# Patient Record
Sex: Male | Born: 1990 | Race: White | Hispanic: No | Marital: Single | Smoking: Never smoker
Health system: Southern US, Community
[De-identification: ages and names within clinical notes are randomized; demographics above are authoritative.]

## PROBLEM LIST (undated history)

## (undated) HISTORY — PX: LYMPH NODE BIOPSY: SHX201

---
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
## 2017-09-22 ENCOUNTER — Other Ambulatory Visit: Payer: Self-pay | Admitting: Surgery

## 2017-09-22 DIAGNOSIS — R221 Localized swelling, mass and lump, neck: Secondary | ICD-10-CM

## 2017-10-13 ENCOUNTER — Ambulatory Visit
Admission: RE | Admit: 2017-10-13 | Discharge: 2017-10-13 | Disposition: A | Discharge status: Home / Self Care | Payer: BLUE CROSS/BLUE SHIELD | Source: Ambulatory Visit | Attending: Surgery | Admitting: Surgery

## 2017-10-13 ENCOUNTER — Other Ambulatory Visit: Payer: Self-pay | Admitting: Surgery

## 2017-10-13 DIAGNOSIS — R221 Localized swelling, mass and lump, neck: Secondary | ICD-10-CM

## 2017-10-19 ENCOUNTER — Other Ambulatory Visit: Payer: Self-pay | Admitting: Surgery

## 2017-10-19 DIAGNOSIS — R221 Localized swelling, mass and lump, neck: Secondary | ICD-10-CM

## 2017-10-24 ENCOUNTER — Ambulatory Visit
Admission: RE | Admit: 2017-10-24 | Discharge: 2017-10-24 | Disposition: A | Discharge status: Home / Self Care | Payer: BLUE CROSS/BLUE SHIELD | Source: Ambulatory Visit | Attending: Surgery | Admitting: Surgery

## 2017-10-24 DIAGNOSIS — R221 Localized swelling, mass and lump, neck: Secondary | ICD-10-CM

## 2017-10-24 MED ORDER — IOPAMIDOL (ISOVUE-300) INJECTION 61%
75.0000 mL | Freq: Once | INTRAVENOUS | Status: AC | PRN
  Administered 2017-10-24: 75 mL via INTRAVENOUS

## 2017-11-08 ENCOUNTER — Other Ambulatory Visit (HOSPITAL_COMMUNITY): Payer: Self-pay | Admitting: Surgery

## 2017-11-08 DIAGNOSIS — R221 Localized swelling, mass and lump, neck: Secondary | ICD-10-CM

## 2017-11-14 ENCOUNTER — Other Ambulatory Visit: Payer: Self-pay | Admitting: General Surgery

## 2017-11-14 ENCOUNTER — Other Ambulatory Visit: Payer: Self-pay | Admitting: Radiology

## 2017-11-15 ENCOUNTER — Ambulatory Visit (HOSPITAL_COMMUNITY)
Admission: RE | Admit: 2017-11-15 | Discharge: 2017-11-15 | Disposition: A | Discharge status: Home / Self Care | Payer: BLUE CROSS/BLUE SHIELD | Source: Ambulatory Visit | Attending: Surgery | Admitting: Surgery

## 2017-11-15 DIAGNOSIS — R590 Localized enlarged lymph nodes: Secondary | ICD-10-CM | POA: Insufficient documentation

## 2017-11-15 DIAGNOSIS — R221 Localized swelling, mass and lump, neck: Secondary | ICD-10-CM

## 2017-11-15 LAB — CBC
HCT: 48.4 % (ref 39.0–52.0)
Hemoglobin: 17.2 g/dL — ABNORMAL HIGH (ref 13.0–17.0)
MCH: 30.6 pg (ref 26.0–34.0)
MCHC: 35.5 g/dL (ref 30.0–36.0)
MCV: 86.1 fL (ref 78.0–100.0)
PLATELETS: 194 10*3/uL (ref 150–400)
RBC: 5.62 MIL/uL (ref 4.22–5.81)
RDW: 12.7 % (ref 11.5–15.5)
WBC: 5.5 10*3/uL (ref 4.0–10.5)

## 2017-11-15 LAB — PROTIME-INR
INR: 1.1
PROTHROMBIN TIME: 14.1 s (ref 11.4–15.2)

## 2017-11-15 MED ORDER — MIDAZOLAM HCL 2 MG/2ML IJ SOLN
INTRAMUSCULAR | Status: AC | PRN
  Administered 2017-11-15 (×3): 1 mg via INTRAVENOUS

## 2017-11-15 MED ORDER — FENTANYL CITRATE (PF) 100 MCG/2ML IJ SOLN
INTRAMUSCULAR | Status: AC | PRN
  Administered 2017-11-15 (×2): 50 ug via INTRAVENOUS

## 2017-11-15 MED ORDER — SODIUM CHLORIDE 0.9 % IV SOLN: INTRAVENOUS | Status: DC

## 2017-11-15 MED ORDER — LIDOCAINE HCL (PF) 1 % IJ SOLN
INTRAMUSCULAR | Status: AC
  Filled 2017-11-15: qty 30

## 2017-11-15 MED ORDER — MIDAZOLAM HCL 2 MG/2ML IJ SOLN
INTRAMUSCULAR | Status: AC
  Filled 2017-11-15: qty 4

## 2017-11-15 MED ORDER — FENTANYL CITRATE (PF) 100 MCG/2ML IJ SOLN
INTRAMUSCULAR | Status: AC
  Filled 2017-11-15: qty 4

## 2017-11-15 NOTE — H&P (Signed)
Chief Complaint: Patient was seen in consultation today for right neck mass biopsy at the request of Connor,Chelsea A  Referring Physician(s): Connor,Chelsea A  Supervising Physician: Gilmer MorWagner, Jaime  [facility] Status: [facility] - [facility]  History of Present Illness: Jose Schwartz is a 26 y.o. male   Pt noticed right lymph node approx 1 yr ago Area had not changed ; denies pain When at beach this summer, he was treated for a cut on leg and  was directed to see MD if this LN persisted after treatment. Nonsmoker; no smokeless tobacco He now has had US and CT CT 10/24/17: IMPRESSION: 1. Massively enlarged right level IIa cervical lymph node measuring 3.6 x 2.3 x 3.9 cm, corresponding to the palpable area of concern. Histologic sampling should be considered to assess for possible neoplastic or lymphoproliferative origin. 2. Multiple smaller, adjacent right cervical lymph nodes. 3. No focal abnormality of the pharynx, larynx or salivary glands.  Now scheduled for LN biopsy  No past medical history on file.    Allergies: Patient has no known allergies.  Medications: Prior to Admission medications   Not on File     No family history on file.  Social History   Socioeconomic History  . Marital status: Single    Spouse name: Not on file  . Number of children: Not on file  . Years of education: Not on file  . Highest education level: Not on file  Social Needs  . Financial resource strain: Not on file  . Food insecurity - worry: Not on file  . Food insecurity - inability: Not on file  . Transportation needs - medical: Not on file  . Transportation needs - non-medical: Not on file  Occupational History  . Not on file  Tobacco Use  . Smoking status: Not on file  Substance and Sexual Activity  . Alcohol use: Not on file  . Drug use: Not on file  . Sexual activity: Not on file  Other Topics Concern  . Not on file  Social History Narrative  . Not on file      Review of Systems: A 12 point ROS discussed and pertinent positives are indicated in the HPI above.  All other systems are negative.  Review of Systems  Constitutional: Negative for activity change, fatigue and fever.  HENT: Negative for facial swelling, sore throat and trouble swallowing.   Respiratory: Negative for cough and shortness of breath.   Psychiatric/Behavioral: Negative for behavioral problems and confusion.    Vital Signs: BP 118/82 (BP Location: Right Arm)   Pulse 68   Temp 97.7 F (36.5 C) (Oral)   Ht 5\' 9"  (1.753 m)   Wt 160 lb (72.6 kg)   SpO2 100%   BMI 23.63 kg/m   Physical Exam  Constitutional: He is oriented to person, place, and time. He appears well-nourished.  Cardiovascular: Normal rate and regular rhythm.  Pulmonary/Chest: Effort normal and breath sounds normal.  Abdominal: Soft. Bowel sounds are normal.  Musculoskeletal: Normal range of motion.  Lymphadenopathy:    He has cervical adenopathy.  Neurological: He is alert and oriented to person, place, and time.  Skin: Skin is warm and dry.  Psychiatric: He has a normal mood and affect. His behavior is normal. Judgment and thought content normal.  Nursing note and vitals reviewed.   Imaging: Ct Soft Tissue Neck W Contrast  Result Date: 10/24/2017 CLINICAL DATA:  Right submandibular mass EXAM: CT NECK WITH CONTRAST TECHNIQUE: Multidetector CT imaging of the  neck was performed using the standard protocol following the bolus administration of intravenous contrast. CONTRAST:  75mL ISOVUE-300 IOPAMIDOL (ISOVUE-300) INJECTION 61% COMPARISON:  Neck ultrasound 10/13/2017 FINDINGS: Pharynx and larynx: --Nasopharynx: Fossae of Rosenmuller are clear. Normal adenoid tonsils for age. --Oral cavity and oropharynx: The palatine and lingual tonsils are normal. The visible oral cavity and floor of mouth are normal. --Hypopharynx: Normal vallecula and pyriform sinuses. --Larynx: Normal epiglottis and pre-epiglottic  space. Normal aryepiglottic and vocal folds. --Retropharyngeal space: No abscess, effusion or lymphadenopathy. Salivary glands: --Parotid: No mass lesion or inflammation. No sialolithiasis or ductal dilatation. --Submandibular: Symmetric without inflammation. No sialolithiasis or ductal dilatation. --Sublingual: Normal. No ranula or other visible lesion of the base of tongue and floor of mouth. Thyroid: Normal. Lymph nodes: The palpable area of concern corresponds to an enlarged right level IIa cervical lymph node that measures 3.6 x 2.3 x 3.9 cm (AP x Transverse x CC). A slightly more posterior level IIa node measures 11 mm. No contralateral pathologically enlarged or abnormal density nodes. Vascular: Major cervical vessels are patent. Limited intracranial: Normal. Visualized orbits: Normal. Mastoids and visualized paranasal sinuses: No fluid levels or advanced mucosal thickening. No mastoid effusion. Skeleton: No bony spinal canal stenosis. No lytic or blastic lesions. Upper chest: Clear. Other: None. IMPRESSION: 1. Massively enlarged right level IIa cervical lymph node measuring 3.6 x 2.3 x 3.9 cm, corresponding to the palpable area of concern. Histologic sampling should be considered to assess for possible neoplastic or lymphoproliferative origin. 2. Multiple smaller, adjacent right cervical lymph nodes. 3. No focal abnormality of the pharynx, larynx or salivary glands. Electronically Signed   By: Deatra RobinsonKevin  Herman M.D.   On: 10/24/2017 20:50    Labs:  CBC: No results for input(s): WBC, HGB, HCT, PLT in the last 8760 hours.  COAGS: No results for input(s): INR, APTT in the last 8760 hours.  BMP: No results for input(s): NA, K, CL, CO2, GLUCOSE, BUN, CALCIUM, CREATININE, GFRNONAA, GFRAA in the last 8760 hours.  Invalid input(s): CMP  LIVER FUNCTION TESTS: No results for input(s): BILITOT, AST, ALT, ALKPHOS, PROT, ALBUMIN in the last 8760 hours.  TUMOR MARKERS: No results for input(s): AFPTM, CEA,  CA199, CHROMGRNA in the last 8760 hours.  Assessment and Plan:  Persistent right cervical lymph node enlargement Scheduled for biopsy of same Risks and benefits discussed with the patient including, but not limited to bleeding, infection, damage to adjacent structures or low yield requiring additional tests. All of the patient's questions were answered, patient is agreeable to proceed. Consent signed and in chart.   Thank you for this interesting consult.  I greatly enjoyed meeting Jose JennyLandon Lyles and look forward to participating in their care.  A copy of this report was sent to the requesting provider on this date.  Electronically Signed: Robet LeuURPIN,Ikeem Cleckler A, PA-C 11/15/2017, 12:24 PM   I spent a total of  30 Minutes   in face to face in clinical consultation, greater than 50% of which was counseling/coordinating care for Rt neck LAN biopsy

## 2017-11-15 NOTE — Discharge Instructions (Signed)

## 2017-11-15 NOTE — Procedures (Signed)
Interventional Radiology Procedure Note  Procedure: US guided right neck lymph node, just posterior to the right submandibular gland. Mx 18g core biopsy .  Complications: None Recommendations:  - Ok to shower tomorrow - Do not submerge for 7 days - Routine care   Signed,  Yvone NeuJaime S. Loreta AveWagner, DO

## 2017-11-22 ENCOUNTER — Telehealth: Payer: Self-pay | Admitting: Surgery

## 2017-11-22 NOTE — Telephone Encounter (Signed)
Called and spoke to pt about biopsy results. Excisional biopsy recommended. Will refer to ENT given location.

## 2017-12-20 ENCOUNTER — Other Ambulatory Visit: Payer: Self-pay | Admitting: Otolaryngology

## 2018-01-16 ENCOUNTER — Encounter (HOSPITAL_COMMUNITY): Payer: Self-pay

## 2018-01-16 ENCOUNTER — Encounter (HOSPITAL_COMMUNITY)
Admission: RE | Admit: 2018-01-16 | Discharge: 2018-01-16 | Disposition: A | Discharge status: Home / Self Care | Payer: BLUE CROSS/BLUE SHIELD | Source: Ambulatory Visit | Attending: Otolaryngology | Admitting: Otolaryngology

## 2018-01-16 ENCOUNTER — Other Ambulatory Visit: Payer: Self-pay

## 2018-01-16 DIAGNOSIS — R590 Localized enlarged lymph nodes: Secondary | ICD-10-CM | POA: Diagnosis present

## 2018-01-16 DIAGNOSIS — D696 Thrombocytopenia, unspecified: Secondary | ICD-10-CM | POA: Diagnosis not present

## 2018-01-16 LAB — CBC
HCT: 46.6 % (ref 39.0–52.0)
HEMOGLOBIN: 16.1 g/dL (ref 13.0–17.0)
MCH: 31.4 pg (ref 26.0–34.0)
MCHC: 34.5 g/dL (ref 30.0–36.0)
MCV: 91 fL (ref 78.0–100.0)
Platelets: 89 10*3/uL — ABNORMAL LOW (ref 150–400)
RBC: 5.12 MIL/uL (ref 4.22–5.81)
RDW: 13.2 % (ref 11.5–15.5)
WBC: 6.9 10*3/uL (ref 4.0–10.5)

## 2018-01-16 NOTE — Pre-Procedure Instructions (Signed)
[facility]  [facility]16/[facility]      [facility] [phone] - [name] - [address] AT [facility] [address] [facility] [address] [place] [address] Phone: [phone] Fax: [phone]    Your procedure is scheduled on 01/18/2018  [facility] Admitting at 6:30 A.M.  Call this number if you have problems the morning of surgery:  [phone]   Remember:  Do not eat food or drink liquids after midnight.  On Tuesday   Take these medicines the morning of surgery with A SIP OF WATER : NONE   Do not wear jewelry   Do not wear lotions, powders, or perfumes, or deodorant.              Men may shave face and neck.   Do not bring valuables to the hospital.   [facility] is not responsible for any belongings or valuables.  Contacts, dentures or bridgework may not be worn into surgery.  Leave your suitcase in the car.  After surgery it may be brought to your room.  For patients admitted to the hospital, discharge time will be determined by your treatment team.  Patients discharged the day of surgery will not be allowed to drive home.   Name and phone number of your driver:   With family  Special instructions:  Special Instructions: [place] - Preparing for Surgery  Before surgery, you can play an important role.  Because skin is not sterile, your skin needs to be as free of germs as possible.  You can reduce the number of germs on you skin by washing with CHG (chlorahexidine gluconate) soap before surgery.  CHG is an antiseptic cleaner which kills germs and bonds with the skin to continue killing germs even after washing.  Please DO NOT use if you have an allergy to CHG or antibacterial soaps.  If your skin becomes reddened/irritated stop using the CHG and inform your nurse when you arrive at Short Stay.  Do not shave (including legs and underarms) for at least 48 hours prior to the first CHG shower.  You may shave your face.  Please  follow these instructions carefully:   1.  Shower with CHG Soap the night before surgery and the  morning of Surgery.  2.  If you choose to wash your hair, wash your hair first as usual with your  normal shampoo.  3.  After you shampoo, rinse your hair and body thoroughly to remove the  Shampoo.  4.  Use CHG as you would any other liquid soap.  You can apply chg directly to the skin and wash gently with scrungie or a clean washcloth.  5.  Apply the CHG Soap to your body ONLY FROM THE NECK DOWN.    Do not use on open wounds or open sores.  Avoid contact with your eyes, ears, mouth and genitals (private parts).  Wash genitals (private parts)   with your normal soap.  6.  Wash thoroughly, paying special attention to the area where your surgery will be performed.  7.  Thoroughly rinse your body with warm water from the neck down.  8.  DO NOT shower/wash with your normal soap after using and rinsing off   the CHG Soap.  9.  Pat yourself dry with a clean towel.            10.  Wear clean pajamas.  11.  Place clean sheets on your bed the night of your first shower and do not sleep with pets.  Day of Surgery  Do not apply any lotions/deodorants the morning of surgery.  Please wear clean clothes to the hospital/surgery center.  Please read over the following fact sheets that you were given. Pain Booklet and Surgical Site Infection Prevention

## 2018-01-16 NOTE — Progress Notes (Signed)
Pt. Reports that the lotrimin that he was using on an irritated site in his private area has not cleared it up.  Pt. Reports that its raised & red, was seen in urgent care & had some testing of the area & it was neg. He was also rx'd an oral med. For which he doesn't remember the name of but he only took two doses & then [place] to go [place] & the pharmacy couldn't give him anymore due to insurance issues. Pt. Encouraged to followup with dermatology.

## 2018-01-17 NOTE — Anesthesia Preprocedure Evaluation (Signed)
Anesthesia Evaluation  Patient identified by MRN, date of birth, ID band Patient awake    Reviewed: Allergy & Precautions, NPO status , Patient's Chart, lab work & pertinent test results  Airway Mallampati: II  TM Distance: >3 FB Neck ROM: Full    Dental no notable dental hx.    Pulmonary neg pulmonary ROS,    Pulmonary exam normal breath sounds clear to auscultation       Cardiovascular negative cardio ROS Normal cardiovascular exam Rhythm:Regular Rate:Normal     Neuro/Psych negative neurological ROS  negative psych ROS   GI/Hepatic negative GI ROS, Neg liver ROS,   Endo/Other  negative endocrine ROS  Renal/GU negative Renal ROS  negative genitourinary   Musculoskeletal negative musculoskeletal ROS (+)   Abdominal   Peds negative pediatric ROS (+)  Hematology Thrombocytopenia   Anesthesia Other Findings   Reproductive/Obstetrics negative OB ROS                             Anesthesia Physical Anesthesia Plan  ASA: I  Anesthesia Plan: General   Post-op Pain Management:    Induction: Intravenous  PONV Risk Score and Plan: 1 and Treatment may vary due to age or medical condition, Ondansetron and Dexamethasone  Airway Management Planned: LMA and Oral ETT  Additional Equipment:   Intra-op Plan:   Post-operative Plan: Extubation in OR  Informed Consent: I have reviewed the patients History and Physical, chart, labs and discussed the procedure including the risks, benefits and alternatives for the proposed anesthesia with the patient or authorized representative who has indicated his/her understanding and acceptance.     Plan Discussed with: CRNA, Surgeon and Anesthesiologist  Anesthesia Plan Comments: ( )        Anesthesia Quick Evaluation

## 2018-01-18 ENCOUNTER — Ambulatory Visit (HOSPITAL_COMMUNITY): Payer: BLUE CROSS/BLUE SHIELD | Admitting: Anesthesiology

## 2018-01-18 ENCOUNTER — Encounter (HOSPITAL_COMMUNITY)
Admission: RE | Disposition: A | Discharge status: Home / Self Care | Payer: Self-pay | Source: Ambulatory Visit | Attending: Otolaryngology

## 2018-01-18 ENCOUNTER — Ambulatory Visit (HOSPITAL_COMMUNITY)
Admission: RE | Admit: 2018-01-18 | Discharge: 2018-01-18 | Disposition: A | Discharge status: Home / Self Care | Payer: BLUE CROSS/BLUE SHIELD | Source: Ambulatory Visit | Attending: Otolaryngology | Admitting: Otolaryngology

## 2018-01-18 DIAGNOSIS — D696 Thrombocytopenia, unspecified: Secondary | ICD-10-CM | POA: Insufficient documentation

## 2018-01-18 DIAGNOSIS — R221 Localized swelling, mass and lump, neck: Secondary | ICD-10-CM

## 2018-01-18 DIAGNOSIS — R590 Localized enlarged lymph nodes: Secondary | ICD-10-CM | POA: Insufficient documentation

## 2018-01-18 HISTORY — PX: EXCISION MASS NECK: SHX6703

## 2018-01-18 SURGERY — EXCISION, MASS, NECK
Anesthesia: General | Site: Neck | Laterality: Right

## 2018-01-18 MED ORDER — CEFAZOLIN SODIUM-DEXTROSE 2-4 GM/100ML-% IV SOLN
2.0000 g | INTRAVENOUS | Status: AC
  Administered 2018-01-18: 2 g via INTRAVENOUS
  Filled 2018-01-18: qty 100

## 2018-01-18 MED ORDER — KETOROLAC TROMETHAMINE 30 MG/ML IJ SOLN: 30.0000 mg | Freq: Once | INTRAMUSCULAR | Status: DC | PRN

## 2018-01-18 MED ORDER — PROPOFOL 10 MG/ML IV BOLUS
INTRAVENOUS | Status: DC | PRN
  Administered 2018-01-18: 50 mg via INTRAVENOUS
  Administered 2018-01-18: 200 mg via INTRAVENOUS

## 2018-01-18 MED ORDER — OXYCODONE HCL 5 MG PO TABS
5.0000 mg | ORAL_TABLET | Freq: Once | ORAL | Status: AC | PRN
  Administered 2018-01-18: 5 mg via ORAL

## 2018-01-18 MED ORDER — PHENYLEPHRINE HCL 10 MG/ML IJ SOLN
INTRAMUSCULAR | Status: DC | PRN
  Administered 2018-01-18: 80 ug via INTRAVENOUS

## 2018-01-18 MED ORDER — OXYCODONE HCL 5 MG PO TABS
ORAL_TABLET | ORAL | Status: AC
  Filled 2018-01-18: qty 1

## 2018-01-18 MED ORDER — EPHEDRINE 5 MG/ML INJ
INTRAVENOUS | Status: AC
  Filled 2018-01-18: qty 10

## 2018-01-18 MED ORDER — FENTANYL CITRATE (PF) 250 MCG/5ML IJ SOLN
INTRAMUSCULAR | Status: AC
  Filled 2018-01-18: qty 5

## 2018-01-18 MED ORDER — DEXAMETHASONE SODIUM PHOSPHATE 10 MG/ML IJ SOLN
10.0000 mg | Freq: Once | INTRAMUSCULAR | Status: AC
  Administered 2018-01-18: 10 mg via INTRAVENOUS
  Filled 2018-01-18: qty 1

## 2018-01-18 MED ORDER — FENTANYL CITRATE (PF) 250 MCG/5ML IJ SOLN
INTRAMUSCULAR | Status: DC | PRN
  Administered 2018-01-18: 100 ug via INTRAVENOUS

## 2018-01-18 MED ORDER — MIDAZOLAM HCL 5 MG/5ML IJ SOLN
INTRAMUSCULAR | Status: DC | PRN
  Administered 2018-01-18: 2 mg via INTRAVENOUS

## 2018-01-18 MED ORDER — CHLORHEXIDINE GLUCONATE CLOTH 2 % EX PADS: 6.0000 | MEDICATED_PAD | Freq: Once | CUTANEOUS | Status: DC

## 2018-01-18 MED ORDER — FENTANYL CITRATE (PF) 100 MCG/2ML IJ SOLN: 25.0000 ug | INTRAMUSCULAR | Status: DC | PRN

## 2018-01-18 MED ORDER — OXYCODONE HCL 5 MG/5ML PO SOLN: 5.0000 mg | Freq: Once | ORAL | Status: AC | PRN

## 2018-01-18 MED ORDER — HYDROCODONE-ACETAMINOPHEN 5-325 MG PO TABS: 1.0000 | ORAL_TABLET | Freq: Four times a day (QID) | ORAL | 0 refills | Status: DC | PRN

## 2018-01-18 MED ORDER — PROPOFOL 10 MG/ML IV BOLUS
INTRAVENOUS | Status: AC
  Filled 2018-01-18: qty 40

## 2018-01-18 MED ORDER — LACTATED RINGERS IV SOLN
INTRAVENOUS | Status: DC | PRN
  Administered 2018-01-18 (×2): via INTRAVENOUS

## 2018-01-18 MED ORDER — ROCURONIUM BROMIDE 10 MG/ML (PF) SYRINGE
PREFILLED_SYRINGE | INTRAVENOUS | Status: AC
  Filled 2018-01-18: qty 5

## 2018-01-18 MED ORDER — LIDOCAINE-EPINEPHRINE 1 %-1:100000 IJ SOLN
INTRAMUSCULAR | Status: DC | PRN
  Administered 2018-01-18: 2 mL

## 2018-01-18 MED ORDER — MEPERIDINE HCL 50 MG/ML IJ SOLN: 6.2500 mg | INTRAMUSCULAR | Status: DC | PRN

## 2018-01-18 MED ORDER — SUCCINYLCHOLINE CHLORIDE 200 MG/10ML IV SOSY
PREFILLED_SYRINGE | INTRAVENOUS | Status: AC
  Filled 2018-01-18: qty 10

## 2018-01-18 MED ORDER — ACETAMINOPHEN 325 MG PO TABS: 325.0000 mg | ORAL_TABLET | ORAL | Status: DC | PRN

## 2018-01-18 MED ORDER — ROCURONIUM BROMIDE 100 MG/10ML IV SOLN
INTRAVENOUS | Status: DC | PRN
  Administered 2018-01-18: 50 mg via INTRAVENOUS

## 2018-01-18 MED ORDER — 0.9 % SODIUM CHLORIDE (POUR BTL) OPTIME
TOPICAL | Status: DC | PRN
  Administered 2018-01-18: 1000 mL

## 2018-01-18 MED ORDER — EPHEDRINE SULFATE 50 MG/ML IJ SOLN
INTRAMUSCULAR | Status: DC | PRN
  Administered 2018-01-18: 10 mg via INTRAVENOUS
  Administered 2018-01-18: 5 mg via INTRAVENOUS
  Administered 2018-01-18: 10 mg via INTRAVENOUS

## 2018-01-18 MED ORDER — MIDAZOLAM HCL 2 MG/2ML IJ SOLN
INTRAMUSCULAR | Status: AC
  Filled 2018-01-18: qty 2

## 2018-01-18 MED ORDER — LIDOCAINE-EPINEPHRINE 1 %-1:100000 IJ SOLN
INTRAMUSCULAR | Status: AC
  Filled 2018-01-18: qty 1

## 2018-01-18 MED ORDER — ONDANSETRON HCL 4 MG/2ML IJ SOLN
INTRAMUSCULAR | Status: DC | PRN
  Administered 2018-01-18: 4 mg via INTRAVENOUS

## 2018-01-18 MED ORDER — ONDANSETRON HCL 4 MG/2ML IJ SOLN
INTRAMUSCULAR | Status: AC
Start: 2018-01-18 — End: ?
  Filled 2018-01-18: qty 2

## 2018-01-18 MED ORDER — LIDOCAINE 2% (20 MG/ML) 5 ML SYRINGE
INTRAMUSCULAR | Status: AC
  Filled 2018-01-18: qty 5

## 2018-01-18 MED ORDER — ONDANSETRON HCL 4 MG/2ML IJ SOLN: 4.0000 mg | Freq: Once | INTRAMUSCULAR | Status: DC | PRN

## 2018-01-18 MED ORDER — ACETAMINOPHEN 160 MG/5ML PO SOLN: 325.0000 mg | ORAL | Status: DC | PRN

## 2018-01-18 MED ORDER — SUGAMMADEX SODIUM 200 MG/2ML IV SOLN
INTRAVENOUS | Status: DC | PRN
  Administered 2018-01-18: 147.8 mg via INTRAVENOUS

## 2018-01-18 SURGICAL SUPPLY — 26 items
BLADE SURG 15 STRL LF DISP TIS (BLADE) ×1 IMPLANT
BLADE SURG 15 STRL SS (BLADE) ×2
CLEANER TIP ELECTROSURG 2X2 (MISCELLANEOUS) IMPLANT
COVER SURGICAL LIGHT HANDLE (MISCELLANEOUS) ×3 IMPLANT
DERMABOND ADHESIVE PROPEN (GAUZE/BANDAGES/DRESSINGS) ×2
DERMABOND ADVANCED (GAUZE/BANDAGES/DRESSINGS) ×2
DERMABOND ADVANCED .7 DNX12 (GAUZE/BANDAGES/DRESSINGS) ×1 IMPLANT
DERMABOND ADVANCED .7 DNX6 (GAUZE/BANDAGES/DRESSINGS) ×1 IMPLANT
DRAPE HALF SHEET 40X57 (DRAPES) IMPLANT
ELECT COATED BLADE 2.86 ST (ELECTRODE) ×3 IMPLANT
ELECT REM PT RETURN 9FT ADLT (ELECTROSURGICAL)
ELECTRODE REM PT RTRN 9FT ADLT (ELECTROSURGICAL) IMPLANT
GAUZE SPONGE 4X4 16PLY XRAY LF (GAUZE/BANDAGES/DRESSINGS) ×3 IMPLANT
GLOVE BIOGEL M 7.0 STRL (GLOVE) ×3 IMPLANT
GOWN STRL REUS W/ TWL LRG LVL3 (GOWN DISPOSABLE) ×2 IMPLANT
GOWN STRL REUS W/TWL LRG LVL3 (GOWN DISPOSABLE) ×4
KIT BASIN OR (CUSTOM PROCEDURE TRAY) ×3 IMPLANT
KIT ROOM TURNOVER OR (KITS) ×3 IMPLANT
NEEDLE HYPO 25GX1X1/2 BEV (NEEDLE) ×3 IMPLANT
NS IRRIG 1000ML POUR BTL (IV SOLUTION) ×3 IMPLANT
PAD ARMBOARD 7.5X6 YLW CONV (MISCELLANEOUS) ×6 IMPLANT
PENCIL BUTTON HOLSTER BLD 10FT (ELECTRODE) IMPLANT
SUT VICRYL 4-0 PS2 18IN ABS (SUTURE) IMPLANT
SYR CONTROL 10ML LL (SYRINGE) ×3 IMPLANT
TOWEL OR 17X24 6PK STRL BLUE (TOWEL DISPOSABLE) ×3 IMPLANT
TRAY ENT MC OR (CUSTOM PROCEDURE TRAY) ×3 IMPLANT

## 2018-01-18 NOTE — Anesthesia Procedure Notes (Signed)
Procedure Name: Intubation Date/Time: 01/18/2018 8:35 AM Performed by: Mariea Clonts, CRNA Pre-anesthesia Checklist: Patient identified, Emergency Drugs available, Suction available, Patient being monitored and Timeout performed Patient Re-evaluated:Patient Re-evaluated prior to induction Oxygen Delivery Method: Circle system utilized Preoxygenation: Pre-oxygenation with 100% oxygen Induction Type: IV induction Ventilation: Mask ventilation without difficulty Laryngoscope Size: Mac and 4 Grade View: Grade I Tube type: Oral Tube size: 7.5 mm Number of attempts: 1 Airway Equipment and Method: Stylet Placement Confirmation: ETT inserted through vocal cords under direct vision,  positive ETCO2 and breath sounds checked- equal and bilateral Secured at: 23 cm Tube secured with: Tape Dental Injury: Teeth and Oropharynx as per pre-operative assessment

## 2018-01-18 NOTE — Transfer of Care (Signed)
Immediate Anesthesia Transfer of Care Note  Patient: Jose Schwartz  Procedure(s) Performed: EXCISION MASS NECK (Right Neck)  Patient Location: PACU  Anesthesia Type:General  Level of Consciousness: awake, alert  and oriented  Airway & Oxygen Therapy: Patient Spontanous Breathing and Patient connected to face mask oxygen  Post-op Assessment: Report given to RN, Post -op Vital signs reviewed and stable and Patient moving all extremities X 4  Post vital signs: Reviewed and stable  Last Vitals:  Vitals:   01/18/18 0648 01/18/18 1000  BP: 133/70 (!) 123/55  Pulse: (!) 57 86  Resp: 20 19  Temp: (!) 36.3 C 36.8 C  SpO2: 99% 100%    Last Pain:  Vitals:   01/18/18 0648  TempSrc: Oral         Complications: No apparent anesthesia complications

## 2018-01-18 NOTE — H&P (Signed)
Jose Schwartz is an 27 y.o. male.   Chief Complaint: Right neck mass HPI: History of enlarging right neck mass, needle bx positive for abn cells   No past medical history on file.  Past Surgical History:  Procedure Laterality Date  . ADENOIDECTOMY W/ MYRINGOTOMY  2003    No family history on file. Social History:  reports that  has never smoked. he has never used smokeless tobacco. He reports that he drinks alcohol. He reports that he does not use drugs.  Allergies: No Known Allergies  Medications Prior to Admission  Medication Sig Dispense Refill  . clotrimazole (LOTRIMIN) 1 % cream Apply 1 application topically 2 (two) times daily. Should be completed by 01-14-18    . Multiple Vitamins-Minerals (ADULT GUMMY PO) Take 2 tablets by mouth daily.      Results for orders placed or performed during the hospital encounter of 01/16/18 (from the past 48 hour(s))  CBC     Status: Abnormal   Collection Time: 01/16/18  4:21 PM  Result Value Ref Range   WBC 6.9 4.0 - 10.5 K/uL   RBC 5.12 4.22 - 5.81 MIL/uL   Hemoglobin 16.1 13.0 - 17.0 g/dL   HCT 16.146.6 09.639.0 - 04.552.0 %   MCV 91.0 78.0 - 100.0 fL   MCH 31.4 26.0 - 34.0 pg   MCHC 34.5 30.0 - 36.0 g/dL   RDW 40.913.2 81.111.5 - 91.415.5 %   Platelets 89 (L) 150 - 400 K/uL    Comment: REPEATED TO VERIFY PLATELET COUNT CONFIRMED BY SMEAR Performed at [facility], [address] [place], [place] [address]    No results found.  Review of Systems  Constitutional: Negative.   HENT: Negative.   Respiratory: Negative.   Cardiovascular: Negative.     Blood pressure 133/70, pulse (!) 57, temperature (!) 97.4 F (36.3 C), temperature source Oral, resp. rate 20, weight 73.9 kg (163 lb), SpO2 99 %. Physical Exam  Constitutional: He appears well-developed and well-nourished.  HENT:  Right neck mass  Neck: Normal range of motion. Neck supple.  Cardiovascular: Normal rate.  Respiratory: Effort normal.  GI: Soft.  Lymphadenopathy:    He has  cervical adenopathy.     Assessment/Plan Adm for Excisional biopsy right neck mass  Alter Moss, MD 01/18/2018, 8:27 AM

## 2018-01-18 NOTE — Anesthesia Postprocedure Evaluation (Signed)
Anesthesia Post Note  Patient: Rebekah ChesterfieldLandon Dean Riggle  Procedure(s) Performed: EXCISION MASS NECK (Right Neck)     Patient location during evaluation: PACU Anesthesia Type: General Level of consciousness: awake and alert Pain management: pain level controlled Vital Signs Assessment: post-procedure vital signs reviewed and stable Respiratory status: spontaneous breathing, nonlabored ventilation, respiratory function stable and patient connected to nasal cannula oxygen Cardiovascular status: blood pressure returned to baseline and stable Postop Assessment: no apparent nausea or vomiting Anesthetic complications: no    Last Vitals:  Vitals:   01/18/18 1027 01/18/18 1035  BP: 126/61 130/61  Pulse: 64 60  Resp: 14   Temp: 36.8 C   SpO2: 99% 100%    Last Pain:  Vitals:   01/18/18 1035  TempSrc:   PainSc: 0-No pain                 Rosabelle Jupin

## 2018-01-18 NOTE — Brief Op Note (Signed)
01/18/2018  9:52 AM  PATIENT:  Jose ChesterfieldLandon Dean Padovano  27 y.o. male  PRE-OPERATIVE DIAGNOSIS:  NECK MASS  POST-OPERATIVE DIAGNOSIS:  NECK MASS  PROCEDURE:  Procedure(s): EXCISION MASS NECK (Right)  SURGEON:  Surgeon(s) and Role:    Osborn Coho* Zhanna Melin, MD - Primary  PHYSICIAN ASSISTANT:   ASSISTANTS: Beckey RutterLouise Nordhbladh, PA   ANESTHESIA:   general  EBL:  Minimal  BLOOD ADMINISTERED:none  DRAINS: none   LOCAL MEDICATIONS USED:  LIDOCAINE  and Amount: 2 ml  SPECIMEN:  Source of Specimen:  Right deep neck  DISPOSITION OF SPECIMEN:  PATHOLOGY  COUNTS:  YES  TOURNIQUET:  * No tourniquets in log *  DICTATION: .Other Dictation: Dictation Number [id number]  PLAN OF CARE: Discharge to home after PACU  PATIENT DISPOSITION:  PACU - hemodynamically stable.   Delay start of Pharmacological VTE agent (>24hrs) due to surgical blood loss or risk of bleeding: not applicable

## 2018-01-19 ENCOUNTER — Encounter (HOSPITAL_COMMUNITY): Payer: Self-pay | Admitting: Otolaryngology

## 2018-01-19 NOTE — Op Note (Signed)
NAME:  Jose Schwartz, Jose Schwartz                   ACCOUNT NO.:  MEDICAL RECORD NO.:  [id number]  LOCATION:                                 FACILITY:  PHYSICIAN:  Kinnie Scalesavid L. Annalee GentaShoemaker, M.D.DATE OF BIRTH:  [birth year]  DATE OF PROCEDURE:  01/18/2018 DATE OF DISCHARGE:                              OPERATIVE REPORT   [facility].  PREOPERATIVE DIAGNOSIS:  Right deep neck lymph node.  POSTOPERATIVE DIAGNOSIS:  Right deep neck lymph node.  INDICATION FOR SURGERY:  Right deep neck lymph node.  SURGICAL PROCEDURE:  Excision of right deep neck lymph node.  ANESTHESIA:  General endotracheal.  SURGEON:  Kinnie Scalesavid L. Annalee GentaShoemaker, M.D.  ASSISTANT:  Aquilla HackerLouise Nordbladh, [facility]  There were no complications.  ESTIMATED BLOOD LOSS:  Minimal.  The patient transferred from the operating room to the recovery room in stable condition.  BRIEF HISTORY:  The patient is an, otherwise, healthy 27 year old white male, who was referred to our office for evaluation of an enlarging right superior lateral neck mass.  The patient noted a small asymptomatic mass when shaving.  The mass has been gradually getting bigger.  He was worked up through his medical physician with a needle biopsy performed under ultrasound guidance.  Pathology from the biopsy tissue showed abnormal cells, but the pathologist was unable to make a definitive diagnosis.  The patient underwent CT scanning which showed a 3 cm isolated mass in the right superior lateral neck adjacent to the jugular vein, consistent with lymph node enlargement.  There were no other lymph node or abnormal physical findings.  Based on the patient's history and examination, I recommended excisional biopsy of the mass under general anesthesia.  The risks and benefits of the procedure were discussed in detail with the patient and his family and they understood and agreed with our plan for surgery which was scheduled on an elective basis at [facility].  DESCRIPTION OF PROCEDURE:  The patient was brought to the operating room on January 18, 2018, and placed in a supine position on the operating table.  General endotracheal anesthesia was established without difficulty.  When the patient was adequately anesthetized, he was positioned on the operating table.  A surgical time-out was then performed, the correct identification of the patient and the surgical procedure and the right laterality of the neck mass.  The patient's skin was then injected with 2 mL of 1% lidocaine with 1:100,000 dilution of epinephrine which was injected in a subcutaneous fashion in the skin overlying the palpable neck mass.  The patient was then prepped and draped and prepared for surgery.  With the patient prepared for surgery, a 3 cm horizontally oriented skin incision was created in a preexisting skin crease.  This was carried through the skin underlying subcutaneous tissue.  Platysma muscle was identified and divided using Bovie electrocautery.  Subplatysmal flaps were then elevated superiorly and inferiorly.  The anterior border of the sternocleidomastoid muscle was identified, and this was retracted posteriorly.  Dissection was then carried out deep to the sternocleidomastoid muscle.  There was a large palpable firm soft tissue mass.  The surrounding soft  tissue was carefully divided with cautery and blunt and sharp dissections were undertaken.  Several small blood vessels were divided and suture ligated.  The mass was then elevated from the deep compartment of the neck.  The jugular vein was identified. The posterior belly of the digastric muscle was identified.  The hypoglossal nerve was identified and preserved.  The mass was then removed in its entirety and sent to Pathology for gross and microscopic evaluation and lymphoma workup.  The patient's wound was then irrigated with saline.  There was no active bleeding.  The incision was  closed in multiple layers beginning with reapproximation of the platysma muscle with interrupted 4-0 Vicryl suture.  The deep subcutaneous tissue was then closed with interrupted 5-0 Vicryl suture, and the immediate subcutaneous closure was with interrupted 5-0 Vicryl in a horizontal mattress fashion.  The final skin edge was closed with Dermabond surgical glue.  The patient was then awakened from his anesthetic, extubated, and transferred from the operating room to the recovery room in stable condition.  There were no complications, and estimated blood loss was minimal.          ______________________________ Kinnie Scales. Annalee Genta, M.D.     DLS/MEDQ  D:  16/09/9603  T:  01/19/2018  Job:  [id number]

## 2018-02-01 ENCOUNTER — Telehealth: Payer: Self-pay

## 2018-02-01 NOTE — Telephone Encounter (Signed)
Received call to the office today for urgent referral from Dr. Annalee GentaShoemaker for potential marginal zone lymphoma. Sue LushAndrea, new Patient Scheduler attempted to call pt to have him scheduled at 0840 or 1520 tomorrow, but pt was unaware of the reason for referral and did not want to schedule an appointment at this time. [facility] and left VM with Many, assistant to Dr. Annalee GentaShoemaker to please call the pt as soon as possible so that we would be able to have an initial visit with the pt tomorrow afternoon if possible. [facility] [phone]

## 2018-02-01 NOTE — Progress Notes (Signed)
HEMATOLOGY/ONCOLOGY CONSULTATION NOTE  Date of Service: 02/02/2018  Patient Care Team: Patient, No Pcp Per as PCP - General (General Practice)  CHIEF COMPLAINTS/PURPOSE OF CONSULTATION:  Cervical Lymphadenopathy concerning for lymhpoma  HISTORY OF PRESENTING ILLNESS:   Jose Schwartz is a wonderful 27 y.o. male who has been referred to us by ENT Dr. 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 He is accompanied today by his parents and girlfriend. The pt reports that he is doing well overall.   The pt notes that he has generally been healthy and has not had any medical problems. He first noted a lump on his right neck a year ago, which initially grew very slowly and noted that he felt completely fine.  The rt neck lump gradually become more noticeable and he sought further evaluation.   On 10/24/17 the pt had a CT Soft Tissue Neck revealing 1. Massively enlarged right level IIa cervical lymph node measuring 3.6 x 2.3 x 3.9 cm, corresponding to the palpable area of concern. Histologic sampling should be considered to assess for possible neoplastic or lymphoproliferative origin. 2. Multiple smaller, adjacent right cervical lymph nodes. 3. No focal abnormality of the pharynx, larynx or salivary glands.   The pt had a US guided core biopsy on 11/15/17 showing prominent B cell population with atypical lymphoid proliferation without a definitive diagnosis of lymphoma.   The pt has had excisional biopsy of a right superior lateral neck mass completed on 01/18/18 revealing an abnormal lymphoid proliferation.  Most recent lab results (01/16/18) of CBC  is as follows: all values are WNL except for Platelets at 89k.  He notes that in June 2018 he got a minor cut on his lower right leg that became infected and took antibiotics. He notes that in the last two months he received US guided biopsy, and an excisional biopsy.   He notes that about 6 weeks ago he  noticed a discrete macular dot like rash on his bilateral flanks, under his bilateral arms, wrists, and on his genitals. He then presented to the urgent care on [address] where he had an infection work up and was prescribed Fluconazole and anti-fungal cream that did not clear up his rash. He notes that his rash appeared over a day or two and  that he did not associate its appearance with anything in particular; he notes being very active in the gym and playing basketball and believed his rash to be sports related. He notes not being sure where his rash began first, and that it persists today.   He denies having any animal contact nor owning any pets. He notes that the spots have just started flaking. He notes that the spot on his penis first appeared as bright red but has been resolving slowly. He has stopped using his anti-fungal cream.   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   He notes that about 4 years ago he had a chest rash for which we had a skin biopsy, that was unrevealing and his rash cleared up. [facility] was [facility] [place]. He notes that his dots were white colored and looked similarly to pimples.  He took a medication that he used in the shower and is not sure what it was.  He notes that he had a ganglion cyst on his right wrist in the past.   He notes  that two days ago his throat became sore and also felt weak and felt some chills two nights ago which he treated with Aleve and cough drops. He notes currently feeling weak. He denies taking NSAIDs regularly outside of the last two days. He denies taking steroids or prednisone anytime in the recent past.  On review of systems, pt reports weakness, throat pain, and denies ear aches, fevers, chills, night sweats over the last year, other lumps or bumps, testicular pain or swelling, penal discharge, pain along the spine, abdominal pain, and  any other symptoms.  On PMHx the pt reports having tubes in his ears when he was little, recurrent cold sores, denies unsafe sexual exposure and denies needle or drug use. On Social Hx the pt reports no history of smoking, and works as a Pharmacist, hospital.  On Family Hx the pt reports his mother and her family have had psoriasis and thyroid issues. His father's side has had eczema problems.   MEDICAL HISTORY:  No past medical history on file.  SURGICAL HISTORY: Past Surgical History:  Procedure Laterality Date  . ADENOIDECTOMY W/ MYRINGOTOMY  2003  . EXCISION MASS NECK Right 01/18/2018   Procedure: EXCISION MASS NECK;  Surgeon: Osborn Coho, MD;  Location: [facility] OR;  Service: ENT;  Laterality: Right;    SOCIAL HISTORY: Social History   Socioeconomic History  . Marital status: Single    Spouse name: Not on file  . Number of children: Not on file  . Years of education: Not on file  . Highest education level: Not on file  Social Needs  . Financial resource strain: Not on file  . Food insecurity - worry: Not on file  . Food insecurity - inability: Not on file  . Transportation needs - medical: Not on file  . Transportation needs - non-medical: Not on file  Occupational History  . Not on file  Tobacco Use  . Smoking status: Never Smoker  . Smokeless tobacco: Never Used  Substance and Sexual Activity  . Alcohol use: Yes    Comment: socially- 2 drinks per week   . Drug use: No  . Sexual activity: Not on file  Other Topics Concern  . Not on file  Social History Narrative  . Not on file    FAMILY HISTORY: No family history on file.  ALLERGIES:  has No Known Allergies.  MEDICATIONS:  Current Outpatient Medications  Medication Sig Dispense Refill  . clotrimazole (LOTRIMIN) 1 % cream Apply 1 application topically 2 (two) times daily. Should be completed by 01-14-18    . HYDROcodone-acetaminophen (NORCO) 5-325 MG tablet Take 1-2 tablets by mouth every 6 (six) hours as needed.  20 tablet 0  . Multiple Vitamins-Minerals (ADULT GUMMY PO) Take 2 tablets by mouth daily.     No current facility-administered medications for this visit.     REVIEW OF SYSTEMS:    10 Point review of Systems was done is negative except as noted above.  PHYSICAL EXAMINATION: ECOG PERFORMANCE STATUS: 0 - Asymptomatic  . Vitals:   02/02/18 0848  BP: 128/71  Pulse: 65  Resp: 17  Temp: 98.3 F (36.8 C)  SpO2: 100%   Filed Weights   02/02/18 0848  Weight: 158 lb (71.7 kg)   .Body mass index is 22.83 kg/m.  GENERAL:alert, in no acute distress and comfortable SKIN: no acute rashes, no significant lesions EYES: conjunctiva are pink and non-injected, sclera anicteric OROPHARYNX: MMM, no exudates, no oropharyngeal erythema or ulceration NECK: supple, no  JVD LYMPH:  no palpable lymphadenopathy in the cervical, axillary or inguinal regions LUNGS: clear to auscultation b/l with normal respiratory effort HEART: regular rate & rhythm ABDOMEN:  normoactive bowel sounds , non tender, not distended. Extremity: no pedal edema PSYCH: alert & oriented x 3 with fluent speech NEURO: no focal motor/sensory deficits  LABORATORY DATA:  I have reviewed the data as listed  . CBC Latest Ref Rng & Units 02/02/2018 01/16/2018 11/15/2017  WBC 4.0 - 10.3 K/uL 8.5 6.9 5.5  Hemoglobin 13.0 - 17.1 g/dL 16.1 09.6 17.2(H)  Hematocrit 38.4 - 49.9 % 45.4 46.6 48.4  Platelets 140 - 400 K/uL 219 89(L) 194    . CMP Latest Ref Rng & Units 02/02/2018  Glucose 70 - 140 mg/dL 95  BUN 7 - 26 mg/dL 16  Creatinine 0.45 - 4.09 mg/dL 8.11  Sodium 914 - 782 mmol/L 143  Potassium 3.5 - 5.1 mmol/L 4.1  Chloride 98 - 109 mmol/L 107  CO2 22 - 29 mmol/L 25  Calcium 8.4 - 10.4 mg/dL 95.6  Total Protein 6.4 - 8.3 g/dL 7.6  Total Bilirubin 0.2 - 1.2 mg/dL 2.1(H)  Alkaline Phos 40 - 150 U/L 81  AST 5 - 34 U/L 18  ALT 0 - 55 U/L 22   Component     Latest Ref Rng & Units 02/02/2018  CRP     <1.0 mg/dL 1.4 (H)    Sed Rate     0 - 16 mm/hr 5  Hep B S Ab      Non Reactive  Hepatitis B Surface Ag     Negative Negative  HIV Screen 4th Generation wRfx     Non Reactive Non Reactive  HCV Ab     0.0 - 0.9 s/co ratio <0.1  LDH     125 - 245 U/L 149     01/18/18 Soft Tissue Mass Pathology:    01/18/18 Tissue Flow Cytometry:    RADIOGRAPHIC STUDIES: I have personally reviewed the radiological images as listed and agreed with the findings in the report.  CT soft tissue neck (10/24/2018) CT NECK WITH CONTRAST  TECHNIQUE: Multidetector CT imaging of the neck was performed using the standard protocol following the bolus administration of intravenous contrast.  CONTRAST:  75mL ISOVUE-300 IOPAMIDOL (ISOVUE-300) INJECTION 61%  COMPARISON:  Neck ultrasound 10/13/2017  FINDINGS: Pharynx and larynx:  --Nasopharynx: Fossae of Rosenmuller are clear. Normal adenoid tonsils for age.  --Oral cavity and oropharynx: The palatine and lingual tonsils are normal. The visible oral cavity and floor of mouth are normal.  --Hypopharynx: Normal vallecula and pyriform sinuses.  --Larynx: Normal epiglottis and pre-epiglottic space. Normal aryepiglottic and vocal folds.  --Retropharyngeal space: No abscess, effusion or lymphadenopathy.  Salivary glands:  --Parotid: No mass lesion or inflammation. No sialolithiasis or ductal dilatation.  --Submandibular: Symmetric without inflammation. No sialolithiasis or ductal dilatation.  --Sublingual: Normal. No ranula or other visible lesion of the base of tongue and floor of mouth.  Thyroid: Normal.  Lymph nodes: The palpable area of concern corresponds to an enlarged right level IIa cervical lymph node that measures 3.6 x 2.3 x 3.9 cm (AP x Transverse x CC). A slightly more posterior level IIa node measures 11 mm. No contralateral pathologically enlarged or abnormal density nodes.  Vascular: Major cervical vessels are  patent.  Limited intracranial: Normal.  Visualized orbits: Normal.  Mastoids and visualized paranasal sinuses: No fluid levels or advanced mucosal thickening. No mastoid effusion.  Skeleton: No bony spinal canal stenosis.  No lytic or blastic lesions.  Upper chest: Clear.  Other: None.  IMPRESSION: 1. Massively enlarged right level IIa cervical lymph node measuring 3.6 x 2.3 x 3.9 cm, corresponding to the palpable area of concern. Histologic sampling should be considered to assess for possible neoplastic or lymphoproliferative origin. 2. Multiple smaller, adjacent right cervical lymph nodes. 3. No focal abnormality of the pharynx, larynx or salivary glands.   Electronically Signed   By: Deatra Robinson M.D.   On: 10/24/2017 20:50  ASSESSMENT & PLAN:   27 y.o. male with  1. Rt Cervical Lymphadenopathy S/p Excisional LN biopsy -pathology still under review with 2nd opinion from [facility]. 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  No overt constitutional symptoms. No anemia. Mild thrombocytopenia - now resolved HCV neg. HIV neg  2. Thrombocytopenia ? Related to lymphoma vs medications (NSAIDS) Appears likely related to NSAIDS or passing viral infection with exanthem. Rpt labs today show resolution of thrombocytopenia.  3. Macular rash over penis/trunk and wrist -- scaling and resolving now.  ?Viral exanthem. ? Related to lymphoproliferative process. ?medication related. Had previous chest wall rash a few years ago and was biopsied. We will reach out to [facility] in [place], [place] for the patient's skin biopsy several years ago.  PLAN   -Discussed patient's most recent labs 01/16/18 showing all values WNL except for platelets at 89k.  -Discussed that the initial biopsies in December 2018 could not fully distinguish if the abnormal lymphocytes were reactive or clonal. Cytometry did not reveal definitive clonal populations either.  I discussed his pathology case in details with our pathologist Dr Ronalee Belts. It has been sent to [facility] for a 2nd opinion. He is concerned pathology appears to be suggestive of Pediatric Nodal Marginal zone lymphoma. Not consistent with Hodgkins lymphoma -Discussed that we will discuss the patient's case with tumor board next week.  -Discussed the patient's 10/24/17 CT scan.  -PET scan to understand the pattern of involvement and r/o metastasis and guide further w/u and treatment -Blood flow cytometry - done today neg for clonal lymphocytes --Rash is not confluent, no palpable enlarged lymph nodes, no palpable splenomegaly or hepatomegaly. -Asked pt to note if rash gets worse and we will likely send pt to a dermatologist for a biopsy. -Recommended not taking NSAIDs, but taking Tylenol if necessary. -labs ordered for additional evaluation of rash and lymphoma as noted below . Orders Placed This Encounter  Procedures  . NM PET Image Initial (PI) Skull Base To Thigh    Standing Status:   Future    Standing Expiration Date:   02/02/2019    Order Specific Question:   If indicated for the ordered procedure, I authorize the administration of a radiopharmaceutical per Radiology protocol    Answer:   Yes    Order Specific Question:   Preferred imaging location?    Answer:   [facility]    Order Specific Question:   Radiology Contrast Protocol - do NOT remove file path    Answer:   [email or web address]    Order Specific Question:   Reason for Exam additional comments    Answer:   rt cervical lymphadenopathy -concern for lymphoma for initial staging and to direct diagnostic workup  . CBC & Diff and Retic    Standing Status:   Future    Number of Occurrences:   1    Standing Expiration Date:   02/02/2019  . CMP (Cancer Center only)  Standing Status:   Future    Number of Occurrences:   1    Standing Expiration Date:   02/02/2019  . Lactate dehydrogenase     Standing Status:   Future    Number of Occurrences:   1    Standing Expiration Date:   02/02/2019  . Hepatitis C antibody    Standing Status:   Future    Number of Occurrences:   1    Standing Expiration Date:   02/02/2019  . HIV antibody    Standing Status:   Future    Number of Occurrences:   1    Standing Expiration Date:   02/02/2019  . Hepatitis B surface antigen    Standing Status:   Future    Number of Occurrences:   1    Standing Expiration Date:   02/02/2019  . Hepatitis B surface antibody    Standing Status:   Future    Number of Occurrences:   1    Standing Expiration Date:   03/09/2019  . Smear    Standing Status:   Future    Number of Occurrences:   1    Standing Expiration Date:   02/02/2019  . Flow Cytometry    Atypical lymphocytes and lymphadenopathy-- concern for lymphoma    Standing Status:   Future    Number of Occurrences:   1    Standing Expiration Date:   02/02/2019  . ANA, IFA (with reflex)    Standing Status:   Future    Number of Occurrences:   1    Standing Expiration Date:   02/02/2019  . Sedimentation rate    Standing Status:   Future    Number of Occurrences:   1    Standing Expiration Date:   02/02/2019  . C-reactive protein    Standing Status:   Future    Number of Occurrences:   1    Standing Expiration Date:   02/02/2019    Labs today PET/CT in 5 days RTC with Dr Candise Che in 2 weeks   All of the patients and mulitple family members questions were answered with apparent satisfaction. The patient knows to call the clinic with any problems, questions or concerns.  I spent 60 minutes counseling the patient face to face. The total time spent in the appointment was 80 minutes and more than 50% was on counseling and direct patient cares.    Wyvonnia Lora MD MS AAHIVMS [facility] Hematology/Oncology Physician [facility]  (Office):       [phone] (Work cell):  [phone] (Fax):           [phone]  02/02/2018 8:52 AM  This  document serves as a record of services personally performed by Wyvonnia Lora, MD. It was created on his behalf by Marcelline Mates, a trained medical scribe. The creation of this record is based on the scribe's personal observations and the provider's statements to them.   .I have reviewed the above documentation for accuracy and completeness, and I agree with the above.\ .Johney Maine MD MS

## 2018-02-02 ENCOUNTER — Telehealth: Payer: Self-pay

## 2018-02-02 ENCOUNTER — Inpatient Hospital Stay: Payer: BLUE CROSS/BLUE SHIELD | Attending: Hematology | Admitting: Hematology

## 2018-02-02 ENCOUNTER — Telehealth: Payer: Self-pay | Admitting: Hematology

## 2018-02-02 ENCOUNTER — Inpatient Hospital Stay: Payer: BLUE CROSS/BLUE SHIELD

## 2018-02-02 ENCOUNTER — Encounter: Payer: Self-pay | Admitting: Hematology

## 2018-02-02 VITALS — BP 128/71 | HR 65 | Temp 98.3°F | Resp 17 | Ht 69.75 in | Wt 158.0 lb

## 2018-02-02 DIAGNOSIS — R221 Localized swelling, mass and lump, neck: Secondary | ICD-10-CM

## 2018-02-02 DIAGNOSIS — R21 Rash and other nonspecific skin eruption: Secondary | ICD-10-CM

## 2018-02-02 DIAGNOSIS — Z79899 Other long term (current) drug therapy: Secondary | ICD-10-CM | POA: Diagnosis not present

## 2018-02-02 DIAGNOSIS — R070 Pain in throat: Secondary | ICD-10-CM | POA: Insufficient documentation

## 2018-02-02 DIAGNOSIS — R590 Localized enlarged lymph nodes: Secondary | ICD-10-CM | POA: Diagnosis not present

## 2018-02-02 DIAGNOSIS — Z840 Family history of diseases of the skin and subcutaneous tissue: Secondary | ICD-10-CM | POA: Diagnosis not present

## 2018-02-02 DIAGNOSIS — Z9889 Other specified postprocedural states: Secondary | ICD-10-CM | POA: Diagnosis not present

## 2018-02-02 DIAGNOSIS — R591 Generalized enlarged lymph nodes: Secondary | ICD-10-CM

## 2018-02-02 DIAGNOSIS — R6883 Chills (without fever): Secondary | ICD-10-CM | POA: Diagnosis not present

## 2018-02-02 DIAGNOSIS — R531 Weakness: Secondary | ICD-10-CM | POA: Diagnosis not present

## 2018-02-02 LAB — C-REACTIVE PROTEIN: CRP: 1.4 mg/dL — AB (ref <1.0)

## 2018-02-02 LAB — RETICULOCYTES
RBC.: 5.25 MIL/uL (ref 4.20–5.82)
RETIC COUNT ABSOLUTE: 42 10*3/uL (ref 34.8–93.9)
Retic Ct Pct: 0.8 % (ref 0.8–1.8)

## 2018-02-02 LAB — CBC WITH DIFFERENTIAL/PLATELET
Basophils Absolute: 0 10*3/uL (ref 0.0–0.1)
Basophils Relative: 0 %
Eosinophils Absolute: 0.2 10*3/uL (ref 0.0–0.5)
Eosinophils Relative: 2 %
HEMATOCRIT: 45.4 % (ref 38.4–49.9)
Hemoglobin: 16 g/dL (ref 13.0–17.1)
LYMPHS ABS: 1.3 10*3/uL (ref 0.9–3.3)
LYMPHS PCT: 16 %
MCH: 30.5 pg (ref 27.2–33.4)
MCHC: 35.2 g/dL (ref 32.0–36.0)
MCV: 86.5 fL (ref 79.3–98.0)
MONO ABS: 0.6 10*3/uL (ref 0.1–0.9)
Monocytes Relative: 7 %
NEUTROS ABS: 6.5 10*3/uL (ref 1.5–6.5)
Neutrophils Relative %: 75 %
Platelets: 219 10*3/uL (ref 140–400)
RBC: 5.25 MIL/uL (ref 4.20–5.82)
RDW: 12.6 % (ref 11.0–14.6)
WBC: 8.5 10*3/uL (ref 4.0–10.3)

## 2018-02-02 LAB — CMP (CANCER CENTER ONLY)
ALBUMIN: 4.5 g/dL (ref 3.5–5.0)
ALT: 22 U/L (ref 0–55)
ANION GAP: 11 (ref 3–11)
AST: 18 U/L (ref 5–34)
Alkaline Phosphatase: 81 U/L (ref 40–150)
BUN: 16 mg/dL (ref 7–26)
CO2: 25 mmol/L (ref 22–29)
Calcium: 10.2 mg/dL (ref 8.4–10.4)
Chloride: 107 mmol/L (ref 98–109)
Creatinine: 0.98 mg/dL (ref 0.70–1.30)
GFR, Est AFR Am: >60 mL/min (ref >60)
GFR, Estimated: >60 mL/min (ref >60)
GLUCOSE: 95 mg/dL (ref 70–140)
POTASSIUM: 4.1 mmol/L (ref 3.5–5.1)
SODIUM: 143 mmol/L (ref 136–145)
Total Bilirubin: 1.6 mg/dL — ABNORMAL HIGH (ref 0.2–1.2)
Total Protein: 7.6 g/dL (ref 6.4–8.3)

## 2018-02-02 LAB — LACTATE DEHYDROGENASE: LDH: 149 U/L (ref 125–245)

## 2018-02-02 LAB — SEDIMENTATION RATE: Sed Rate: 5 mm/hr (ref 0–16)

## 2018-02-02 LAB — SAVE SMEAR

## 2018-02-02 NOTE — Telephone Encounter (Signed)
Scheduled appt per 2/28 los - Gave patient AVS and calender per los. Central radiology to contact patient with scan appt.

## 2018-02-02 NOTE — Telephone Encounter (Signed)
Spoke with Jose Schwartz at [facility] in [place]. Requesting lab work and testing from sample removed last year. Jose Schwartz given phone number and fax for Dr. Clyda GreenerKale's RN. To expect a call tomorrow to inform of how long it will take to retrieve this information.  PET scan scheduled for 02/08/18 at 1000. Left VM on pt phone with appt as well as number for Central Scheduling should he decide to reschedule [phone]. Pt should schedule no later than 02/14/18 in the morning. Pt to remain NPO after midnight and directions provided to Radiology at [facility].

## 2018-02-03 LAB — HEPATITIS B SURFACE ANTIBODY,QUALITATIVE: Hep B S Ab: NONREACTIVE

## 2018-02-03 LAB — HEPATITIS C ANTIBODY

## 2018-02-03 LAB — HEPATITIS B SURFACE ANTIGEN: Hepatitis B Surface Ag: NEGATIVE

## 2018-02-03 LAB — FLOW CYTOMETRY

## 2018-02-03 LAB — HIV ANTIBODY (ROUTINE TESTING W REFLEX): HIV SCREEN 4TH GENERATION: NONREACTIVE

## 2018-02-06 LAB — ANTINUCLEAR ANTIBODIES, IFA: ANA Ab, IFA: NEGATIVE

## 2018-02-08 ENCOUNTER — Ambulatory Visit (HOSPITAL_COMMUNITY)
Admission: RE | Admit: 2018-02-08 | Discharge: 2018-02-08 | Disposition: A | Discharge status: Home / Self Care | Payer: BLUE CROSS/BLUE SHIELD | Source: Ambulatory Visit | Attending: Hematology | Admitting: Hematology

## 2018-02-08 DIAGNOSIS — R591 Generalized enlarged lymph nodes: Secondary | ICD-10-CM | POA: Insufficient documentation

## 2018-02-08 LAB — GLUCOSE, CAPILLARY: Glucose-Capillary: 96 mg/dL (ref 65–99)

## 2018-02-08 MED ORDER — FLUDEOXYGLUCOSE F - 18 (FDG) INJECTION
7.7800 | Freq: Once | INTRAVENOUS | Status: AC | PRN
  Administered 2018-02-08: 7.78 via INTRAVENOUS

## 2018-02-13 NOTE — Progress Notes (Signed)
HEMATOLOGY/ONCOLOGY CONSULTATION NOTE  Date of Service: 02/16/2018  Patient Care Team: Patient, No Pcp Per as PCP - General (General Practice)  CHIEF COMPLAINTS/PURPOSE OF CONSULTATION:  Cervical Lymphadenopathy concerning for lymphoma  HISTORY OF PRESENTING ILLNESS:   Jose Schwartz is a wonderful 27 y.o. male who has been referred to [place] by ENT Dr. 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 He is accompanied today by his parents and girlfriend. The pt reports that he is doing well overall.   The pt notes that he has generally been healthy and has not had any medical problems. He first noted a lump on his right neck a year ago, which initially grew very slowly and noted that he felt completely fine.  The rt neck lump gradually become more noticeable and he sought further evaluation.   On 10/24/17 the pt had a CT Soft Tissue Neck revealing 1. Massively enlarged right level IIa cervical lymph node measuring 3.6 x 2.3 x 3.9 cm, corresponding to the palpable area of concern. Histologic sampling should be considered to assess for possible neoplastic or lymphoproliferative origin. 2. Multiple smaller, adjacent right cervical lymph nodes. 3. No focal abnormality of the pharynx, larynx or salivary glands.   The pt had a US guided core biopsy on 11/15/17 showing prominent B cell population with atypical lymphoid proliferation without a definitive diagnosis of lymphoma.   The pt has had excisional biopsy of a right superior lateral neck mass completed on 01/18/18 revealing an abnormal lymphoid proliferation.  Most recent lab results (01/16/18) of CBC  is as follows: all values are WNL except for Platelets at 89k.  He notes that in June 2018 he got a minor cut on his lower right leg that became infected and took antibiotics. He notes that in the last two months he received US guided biopsy, and an excisional biopsy.   He notes that about 6 weeks ago he  noticed a discrete macular dot like rash on his bilateral flanks, under his bilateral arms, wrists, and on his genitals. He then presented to the urgent care on [facility] where he had an infection work up and was prescribed Fluconazole and anti-fungal cream that did not clear up his rash. He notes that his rash appeared over a day or two and  that he did not associate its appearance with anything in particular; he notes being very active in the gym and playing basketball and believed his rash to be sports related. He notes not being sure where his rash began first, and that it persists today.   He denies having any animal contact nor owning any pets. He notes that the spots have just started flaking. He notes that the spot on his penis first appeared as bright red but has been resolving slowly. He has stopped using his anti-fungal cream.   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   He notes that about 4 years ago he had a chest rash for which we had a skin biopsy, that was unrevealing and his rash cleared up. He was seen at [facility] in [place]. He notes that his dots were white colored and looked similarly to pimples.  He took a medication that he used in the shower and is not sure what it was.  He notes that he had a ganglion cyst on his right wrist in the past.   He notes  that two days ago his throat became sore and also felt weak and felt some chills two nights ago which he treated with Aleve and cough drops. He notes currently feeling weak. He denies taking NSAIDs regularly outside of the last two days. He denies taking steroids or prednisone anytime in the recent past.  On review of systems, pt reports weakness, throat pain, and denies ear aches, fevers, chills, night sweats over the last year, other lumps or bumps, testicular pain or swelling, penal discharge, pain along the spine, abdominal pain, and  any other symptoms.  On PMHx the pt reports having tubes in his ears when he was little, recurrent cold sores, denies unsafe sexual exposure and denies needle or drug use. 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  On Family Hx the pt reports his mother and her family have had psoriasis and thyroid issues. His father's side has had eczema problems.    Interval History:  Jose Schwartz returns today regarding his newly diagnosed  atypical nodal marginal zone hyperplasia. The patient's last visit with us was on 02/02/18. He is accompanied today by his parents and girlfriend. The pt reports that he is doing well overall.   The pt notes that his sore throat upon presentation at his last 02/02/18 visit has reduced and nearly completely subsided. He notes that he had bad allergies growing up for which he received allergies when he was about 13. He denies chronic throat symptoms, but ongoing mild allergies. The pt notes that he had his adenoids taken out when he was little. He will follow up with Dr. Annalee GentaShoemaker in two weeks.   He notes that his rash hasn't improved and he plans to go a dermatologist to get this looked at. He has forgotten which dermatology he was referred to.   Of note since the patient's last visit, pt has had PET Skull base to thigh completed on 02/08/18 with results revealing 1. There is increased radiotracer uptake associated with the surgical resection site in the right level 2 region of the neck which is nonspecific in may reflect residual tumor or postsurgical inflammation. Intense radiotracer uptake is associated with bilateral tonsillar pillars. Cannot rule out lymphoma involvement. 2. No hypermetabolic nodes or soft tissue mass identified within the chest, abdomen or pelvis. 3. There is mild diffuse increased radiotracer uptake throughout the axial and appendicular skeleton. Cannot rule out bone marrow Involvement.  On 02/02/18 his  flow cytometry revealed NO MONOCLONAL B-CELL POPULATION OR ABNORMAL T-CELL PHENOTYPE IDENTIFIED.   Lab results (02/02/18) of CBC, CMP, and Reticulocytes is as follows: all values are WNL except for Total Bilirubin at 1.6. CRP 02/02/18 was elevated at 1.4.  Sed Rate 02/02/18 is WNL at 5. ANA Ab, IFA 02/02/18 was negative. Hep B S Ab 02/02/18 was non reactive. Hep B Surface Ag was negative. LDH 02/02/18 was WNL at 149.   On review of systems, pt reports resolving sore throat, persistent rash, and denies any other symptoms.    MEDICAL HISTORY:  History reviewed. No pertinent past medical history.  SURGICAL HISTORY: Past Surgical History:  Procedure Laterality Date  . ADENOIDECTOMY W/ MYRINGOTOMY  2003  . EXCISION MASS NECK Right 01/18/2018   Procedure: EXCISION MASS NECK;  Surgeon: Osborn CohoShoemaker, David, MD;  [facility] OR;  Service: ENT;  Laterality: Right;    SOCIAL HISTORY: Social History   Socioeconomic History  . Marital status: Single    Spouse name: Not  on file  . Number of children: Not on file  . Years of education: Not on file  . Highest education level: Not on file  Social Needs  . Financial resource strain: Not on file  . Food insecurity - worry: Not on file  . Food insecurity - inability: Not on file  . Transportation needs - medical: Not on file  . Transportation needs - non-medical: Not on file  Occupational History  . Not on file  Tobacco Use  . Smoking status: Never Smoker  . Smokeless tobacco: Never Used  Substance and Sexual Activity  . Alcohol use: Yes    Alcohol/week: 1.2 oz    Types: 2 Cans of beer per week    Comment: socially- 2 drinks per week   . Drug use: No  . Sexual activity: Not on file  Other Topics Concern  . Not on file  Social History Narrative  . Not on file    FAMILY HISTORY: History reviewed. No pertinent family history.  ALLERGIES:  has No Known Allergies.  MEDICATIONS:  Current Outpatient Medications  Medication Sig Dispense  Refill  . HYDROcodone-acetaminophen (NORCO) 5-325 MG tablet Take 1-2 tablets by mouth every 6 (six) hours as needed. (Patient not taking: Reported on 02/02/2018) 20 tablet 0  . Multiple Vitamins-Minerals (ADULT GUMMY PO) Take 2 tablets by mouth daily.     No current facility-administered medications for this visit.     REVIEW OF SYSTEMS:    .10 Point review of Systems was done is negative except as noted above.   PHYSICAL EXAMINATION: ECOG PERFORMANCE STATUS: 0 - Asymptomatic  . Vitals:   02/16/18 0858  BP: 120/80  Pulse: 62  Resp: 18  Temp: 97.7 F (36.5 C)  SpO2: 100%   Filed Weights   02/16/18 0858  Weight: 158 lb 11.2 oz (72 kg)   .Body mass index is 22.93 kg/m.  Marland Kitchen GENERAL:alert, in no acute distress and comfortable SKIN: no acute rashes, no significant lesions EYES: conjunctiva are pink and non-injected, sclera anicteric OROPHARYNX: MMM, no exudates, no oropharyngeal erythema or ulceration NECK: supple, no JVD LYMPH:  no palpable lymphadenopathy in the cervical, axillary or inguinal regions LUNGS: clear to auscultation b/l with normal respiratory effort HEART: regular rate & rhythm ABDOMEN:  normoactive bowel sounds , non tender, not distended. Extremity: no pedal edema PSYCH: alert & oriented x 3 with fluent speech NEURO: no focal motor/sensory deficits   LABORATORY DATA:  I have reviewed the data as listed  . CBC Latest Ref Rng & Units 02/02/2018 01/16/2018 11/15/2017  WBC 4.0 - 10.3 K/uL 8.5 6.9 5.5  Hemoglobin 13.0 - 17.1 g/dL 40.9 81.1 17.2(H)  Hematocrit 38.4 - 49.9 % 45.4 46.6 48.4  Platelets 140 - 400 K/uL 219 89(L) 194    . CMP Latest Ref Rng & Units 02/02/2018  Glucose 70 - 140 mg/dL 95  BUN 7 - 26 mg/dL 16  Creatinine 9.14 - 7.82 mg/dL 9.56  Sodium 213 - 086 mmol/L 143  Potassium 3.5 - 5.1 mmol/L 4.1  Chloride 98 - 109 mmol/L 107  CO2 22 - 29 mmol/L 25  Calcium 8.4 - 10.4 mg/dL 57.8  Total Protein 6.4 - 8.3 g/dL 7.6  Total Bilirubin 0.2  - 1.2 mg/dL 4.6(N)  Alkaline Phos 40 - 150 U/L 81  AST 5 - 34 U/L 18  ALT 0 - 55 U/L 22   Component     Latest Ref Rng & Units 02/02/2018  CRP     <  1.0 mg/dL 1.4 (H)  Sed Rate     0 - 16 mm/hr 5  Hep B S Ab      Non Reactive  Hepatitis B Surface Ag     Negative Negative  HIV Screen 4th Generation wRfx     Non Reactive Non Reactive  HCV Ab     0.0 - 0.9 s/co ratio <0.1  LDH     125 - 245 U/L 149     01/18/18 Soft Tissue Mass Pathology:    01/18/18 Tissue Flow Cytometry:   02/02/18 Flow Cytometry:   RADIOGRAPHIC STUDIES: I have personally reviewed the radiological images as listed and agreed with the findings in the report.  CT soft tissue neck (10/24/2018) CT NECK WITH CONTRAST  TECHNIQUE: Multidetector CT imaging of the neck was performed using the standard protocol following the bolus administration of intravenous contrast.  CONTRAST:  75mL ISOVUE-300 IOPAMIDOL (ISOVUE-300) INJECTION 61%  COMPARISON:  Neck ultrasound 10/13/2017  FINDINGS: Pharynx and larynx:  --Nasopharynx: Fossae of Rosenmuller are clear. Normal adenoid tonsils for age.  --Oral cavity and oropharynx: The palatine and lingual tonsils are normal. The visible oral cavity and floor of mouth are normal.  --Hypopharynx: Normal vallecula and pyriform sinuses.  --Larynx: Normal epiglottis and pre-epiglottic space. Normal aryepiglottic and vocal folds.  --Retropharyngeal space: No abscess, effusion or lymphadenopathy.  Salivary glands:  --Parotid: No mass lesion or inflammation. No sialolithiasis or ductal dilatation.  --Submandibular: Symmetric without inflammation. No sialolithiasis or ductal dilatation.  --Sublingual: Normal. No ranula or other visible lesion of the base of tongue and floor of mouth.  Thyroid: Normal.  Lymph nodes: The palpable area of concern corresponds to an enlarged right level IIa cervical lymph node that measures 3.6 x 2.3 x 3.9 cm (AP x  Transverse x CC). A slightly more posterior level IIa node measures 11 mm. No contralateral pathologically enlarged or abnormal density nodes.  Vascular: Major cervical vessels are patent.  Limited intracranial: Normal.  Visualized orbits: Normal.  Mastoids and visualized paranasal sinuses: No fluid levels or advanced mucosal thickening. No mastoid effusion.  Skeleton: No bony spinal canal stenosis. No lytic or blastic lesions.  Upper chest: Clear.  Other: None.  IMPRESSION: 1. Massively enlarged right level IIa cervical lymph node measuring 3.6 x 2.3 x 3.9 cm, corresponding to the palpable area of concern. Histologic sampling should be considered to assess for possible neoplastic or lymphoproliferative origin. 2. Multiple smaller, adjacent right cervical lymph nodes. 3. No focal abnormality of the pharynx, larynx or salivary glands.   Electronically Signed   By: Deatra Robinson M.D.   On: 10/24/2017 20:50  ASSESSMENT & PLAN:   27 y.o. male with  1. Rt Cervical Lymphadenopathy - florid Marginal Zone hyperplasia, There was concern for Pediatric type Marginal Zone lymphoma but no evidence of clonality on flow. IHC, gene reaarangement studies and NGS  No overt constitutional symptoms. No anemia. Mild thrombocytopenia - now resolved HCV neg. HIV neg PET/CT- no other overt evidence of disease. B/l tonsillary uptake likely from recent infection. Low grade uptake in BM - likely from recent infection. No cytopenia or evidence of lymphoma in LN to suggest LN involvement or warrant bone marrow.  2. Thrombocytopenia ? Related to lymphoma vs medications (NSAIDS) Appears likely related to NSAIDS or passing viral infection. Rpt labs today show resolution of thrombocytopenia.  3. Macular rash over penis/trunk and wrist -- scaling and resolving now.  ?Viral exanthem. ? Related to lymphoproliferative process. ?medication related. Had previous chest  wall rash a few years ago  and was biopsied. We will reach out to [facility] in [place], [place] for the patient's skin biopsy several years ago.  PLAN -Discussed that the initial biopsies in December 2018 could not fully distinguish if the abnormal lymphocytes were reactive or clonal. Cytometry did not reveal definitive clonal populations either. I discussed his pathology case in details with our pathologist Dr Ronalee Belts. It has been sent to [facility] for a 2nd opinion. He is concerned pathology appears to be suggestive of Pediatric Nodal Marginal zone lymphoma. This was subsequently noted to be likely florid Marginal Zone hyperplasia,. -Recommended not taking NSAIDs, but taking Tylenol if necessary. -Discussed pt labwork today; LDH and Sed rate was normal; Hepatitis checks were negative.  -Discussed the abnormal anatomical structure of lymph node with pt and his family, indicating a nodal marginal zone hyperplasia. No clonality was picked up after numerous tests.  -Atypical nodal marginal zone hyperplasia which is a reactive process; we will continue literature searches for any other associations with this hyperplasia.  -Discussed PET scan and tonsils lighting up, which is expected given that he had obvious signs of infection at the time of PET.  -He will discuss issues of tonsils with Dr. Annalee Genta and consider if any other ENT workup is necessary. Pt will be seeing Dr. Annalee Genta in 2 weeks and will discuss tonsils in clinic.  -Inflammatory process vs chronic infection as etiologies.  -Pt will be seeking a dermatologist appointment at [facility] soon to follow up on his rash. -Discussed that if he develops any other concerning symptoms, or enlarged lymph nodes to let us know. -Discussed seeing pt back in 3 months, checking rash, lymph nodes, and blood counts. Repeat CT in 6 months.  -Pt does present with large tonsils.  . Orders Placed This Encounter  Procedures  . CBC & Diff and Retic     Standing Status:   Future    Standing Expiration Date:   02/17/2019  . CMP (Cancer Center only)    Standing Status:   Future    Standing Expiration Date:   02/17/2019  . Lactate dehydrogenase    Standing Status:   Future    Standing Expiration Date:   02/17/2019    RTC with Dr Candise Che in 3 months with labs   All of the patients and mulitple family members questions were answered with apparent satisfaction. The patient knows to call the clinic with any problems, questions or concerns.  . The total time spent in the appointment was 30 minutes and more than 50% was on counseling and direct patient cares.      Wyvonnia Lora MD MS AAHIVMS [facility] Hematology/Oncology Physician [facility]  (Office):       [phone] (Work cell):  [phone] (Fax):           [phone]  02/16/2018 9:05 AM  This document serves as a record of services personally performed by Wyvonnia Lora, MD. It was created on his behalf by Marcelline Mates, a trained medical scribe. The creation of this record is based on the scribe's personal observations and the provider's statements to them.   .I have reviewed the above documentation for accuracy and completeness, and I agree with the above. Johney Maine MD MS

## 2018-02-16 ENCOUNTER — Telehealth: Payer: Self-pay | Admitting: Hematology

## 2018-02-16 ENCOUNTER — Encounter: Payer: Self-pay | Admitting: Hematology

## 2018-02-16 ENCOUNTER — Inpatient Hospital Stay: Payer: BLUE CROSS/BLUE SHIELD | Attending: Hematology | Admitting: Hematology

## 2018-02-16 VITALS — BP 120/80 | HR 62 | Temp 97.7°F | Resp 18 | Ht 69.75 in | Wt 158.7 lb

## 2018-02-16 DIAGNOSIS — Z79899 Other long term (current) drug therapy: Secondary | ICD-10-CM | POA: Diagnosis not present

## 2018-02-16 DIAGNOSIS — J111 Influenza due to unidentified influenza virus with other respiratory manifestations: Secondary | ICD-10-CM | POA: Diagnosis not present

## 2018-02-16 DIAGNOSIS — D696 Thrombocytopenia, unspecified: Secondary | ICD-10-CM | POA: Diagnosis not present

## 2018-02-16 DIAGNOSIS — B09 Unspecified viral infection characterized by skin and mucous membrane lesions: Secondary | ICD-10-CM | POA: Diagnosis not present

## 2018-02-16 DIAGNOSIS — R590 Localized enlarged lymph nodes: Secondary | ICD-10-CM | POA: Diagnosis not present

## 2018-02-16 DIAGNOSIS — R21 Rash and other nonspecific skin eruption: Secondary | ICD-10-CM

## 2018-02-16 NOTE — Telephone Encounter (Signed)
Scheduled appt per 3/14 los - Gave patient AVS and calender per los.  

## 2018-02-17 ENCOUNTER — Encounter (HOSPITAL_COMMUNITY): Payer: Self-pay | Admitting: Hematology

## 2018-02-20 ENCOUNTER — Telehealth: Payer: Self-pay | Admitting: *Deleted

## 2018-02-20 NOTE — Telephone Encounter (Signed)
Patient called stating Dr. Candise [facility]Kale [facility] due to rash which may or may not be related to new diagnosis.  Need to determine cause of rash prior to starting treatment.  Patient stated unable to get appointment until July.  Patient stated [facility] requested [facility] to fax over additional information in order to schedule sooner.  Office notes and recent labs faxed to [phone].  Note attached that patient should be seen more urgently in order to determine how to proceed with treatment.  Fax confirmation received.

## 2018-03-14 ENCOUNTER — Telehealth: Payer: Self-pay

## 2018-03-14 NOTE — Telephone Encounter (Signed)
[facility]. Fax number provided [phone]. Biopsy results received at 1402.

## 2018-04-14 ENCOUNTER — Encounter (HOSPITAL_COMMUNITY): Payer: Self-pay

## 2018-05-18 NOTE — Progress Notes (Signed)
HEMATOLOGY/ONCOLOGY CONSULTATION NOTE  Date of Service: 05/19/2018  Patient Care Team: Patient, No Pcp Per as PCP - General (General Practice)  CHIEF COMPLAINTS/PURPOSE OF CONSULTATION:  Cervical Lymphadenopathy concerning for lymphoma  HISTORY OF PRESENTING ILLNESS:   Jose Schwartz is a wonderful 27 y.o. male who has been referred to [place] by ENT Dr. 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 He is accompanied today by his parents and girlfriend. The pt reports that he is doing well overall.   The pt notes that he has generally been healthy and has not had any medical problems. He first noted a lump on his right neck a year ago, which initially grew very slowly and noted that he felt completely fine.  The rt neck lump gradually become more noticeable and he sought further evaluation.   On 10/24/17 the pt had a CT Soft Tissue Neck revealing 1. Massively enlarged right level IIa cervical lymph node measuring 3.6 x 2.3 x 3.9 cm, corresponding to the palpable area of concern. Histologic sampling should be considered to assess for possible neoplastic or lymphoproliferative origin. 2. Multiple smaller, adjacent right cervical lymph nodes. 3. No focal abnormality of the pharynx, larynx or salivary glands.   The pt had a US guided core biopsy on 11/15/17 showing prominent B cell population with atypical lymphoid proliferation without a definitive diagnosis of lymphoma.   The pt has had excisional biopsy of a right superior lateral neck mass completed on 01/18/18 revealing an abnormal lymphoid proliferation.  Most recent lab results (01/16/18) of CBC  is as follows: all values are WNL except for Platelets at 89k.  He notes that in June 2018 he got a minor cut on his lower right leg that became infected and took antibiotics. He notes that in the last two months he received US guided biopsy, and an excisional biopsy.   He notes that about 6 weeks ago he  noticed a discrete macular dot like rash on his bilateral flanks, under his bilateral arms, wrists, and on his genitals. He then presented to the urgent care on [facility] where he had an infection work up and was prescribed Fluconazole and anti-fungal cream that did not clear up his rash. He notes that his rash appeared over a day or two and  that he did not associate its appearance with anything in particular; he notes being very active in the gym and playing basketball and believed his rash to be sports related. He notes not being sure where his rash began first, and that it persists today.   He denies having any animal contact nor owning any pets. He notes that the spots have just started flaking. He notes that the spot on his penis first appeared as bright red but has been resolving slowly. He has stopped using his anti-fungal cream.   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   He notes that about 4 years ago he had a chest rash for which we had a skin biopsy, that was unrevealing and his rash cleared up. He was seen at [facility] in [place]. He notes that his dots were white colored and looked similarly to pimples.  He took a medication that he used in the shower and is not sure what it was.  He notes that he had a ganglion cyst on his right wrist in the past.   He notes  that two days ago his throat became sore and also felt weak and felt some chills two nights ago which he treated with Aleve and cough drops. He notes currently feeling weak. He denies taking NSAIDs regularly outside of the last two days. He denies taking steroids or prednisone anytime in the recent past.  On review of systems, pt reports weakness, throat pain, and denies ear aches, fevers, chills, night sweats over the last year, other lumps or bumps, testicular pain or swelling, penal discharge, pain along the spine, abdominal pain, and  any other symptoms.  On PMHx the pt reports having tubes in his ears when he was little, recurrent cold sores, denies unsafe sexual exposure and denies needle or drug use. 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  On Family Hx the pt reports his mother and her family have had psoriasis and thyroid issues. His father's side has had eczema problems.    Interval History:  Jose Schwartz returns today regarding his cervical LNadenopathy showing atypical nodal marginal zone hyperplasia. The patient's last visit with us was on 02/16/18. The pt reports that he is doing well overall.   The pt reports that he has no new concerns with his tonsils after seeing ENT Dr Annalee GentaShoemaker. He also notes that his right axillary inflammation and rash has continued to subside and he has used an ointment while maintaining follow up with his dermatologist.  The pt notes that he hasn't had any concerns for recent infections. He does not have a PCP at this time but is considering establishing care soon.   Lab results today (05/19/18) of CBC, CMP, and Reticulocytes is as follows: all values are WNL except for Total Bilirubin at 1.4. LDH 05/19/18 is pending  On review of systems, pt reports good energy levels, nearly resolved axillary rash, and denies fevers, chills ,night sweats, throat pain, swallowing issues, new fatigue, noticing any new lumps or bumps, tonsillar pain or discomfort, testicular pain or swelling, leg swelling, and any other symptoms.   MEDICAL HISTORY:  No past medical history on file.  SURGICAL HISTORY: Past Surgical History:  Procedure Laterality Date  . ADENOIDECTOMY W/ MYRINGOTOMY  2003  . EXCISION MASS NECK Right 01/18/2018   Procedure: EXCISION MASS NECK;  Surgeon: Osborn CohoShoemaker, David, MD;  [facility];  Service: ENT;  Laterality: Right;    SOCIAL HISTORY: Social History   Socioeconomic History  . Marital status: Single    Spouse name: Not on  file  . Number of children: Not on file  . Years of education: Not on file  . Highest education level: Not on file  Occupational History  . Not on file  Social Needs  . Financial resource strain: Not on file  . Food insecurity:    Worry: Not on file    Inability: Not on file  . Transportation needs:    Medical: Not on file    Non-medical: Not on file  Tobacco Use  . Smoking status: Never Smoker  . Smokeless tobacco: Never Used  Substance and Sexual Activity  . Alcohol use: Yes    Alcohol/week: 1.2 oz    Types: 2 Cans of beer per week    Comment: socially- 2 drinks per week   . Drug use: No  . Sexual activity: Not on file  Lifestyle  . Physical activity:    Days per week: Not on file    Minutes per session: Not on file  .  Stress: Not on file  Relationships  . Social connections:    Talks on phone: Not on file    Gets together: Not on file    Attends religious service: Not on file    Active member of club or organization: Not on file    Attends meetings of clubs or organizations: Not on file    Relationship status: Not on file  . Intimate partner violence:    Fear of current or ex partner: Not on file    Emotionally abused: Not on file    Physically abused: Not on file    Forced sexual activity: Not on file  Other Topics Concern  . Not on file  Social History Narrative  . Not on file    FAMILY HISTORY: No family history on file.  ALLERGIES:  has No Known Allergies.  MEDICATIONS:  Current Outpatient Medications  Medication Sig Dispense Refill  . HYDROcodone-acetaminophen (NORCO) 5-325 MG tablet Take 1-2 tablets by mouth every 6 (six) hours as needed. (Patient not taking: Reported on 02/02/2018) 20 tablet 0  . Multiple Vitamins-Minerals (ADULT GUMMY PO) Take 2 tablets by mouth daily.     No current facility-administered medications for this visit.     REVIEW OF SYSTEMS:   A 10+ POINT REVIEW OF SYSTEMS WAS OBTAINED including neurology, dermatology, psychiatry,  cardiac, respiratory, lymph, extremities, GI, GU, Musculoskeletal, constitutional, breasts, reproductive, HEENT.  All pertinent positives are noted in the HPI.  All others are negative.   PHYSICAL EXAMINATION: ECOG PERFORMANCE STATUS: 0 - Asymptomatic  . Vitals:   05/19/18 1008  BP: 120/65  Pulse: (!) 52  Resp: 17  Temp: 98.1 F (36.7 C)  SpO2: 100%   Filed Weights   05/19/18 1008  Weight: 163 lb 12.8 oz (74.3 kg)   .Body mass index is 23.67 kg/m.  GENERAL:alert, in no acute distress and comfortable SKIN: no acute rashes, no significant lesions EYES: conjunctiva are pink and non-injected, sclera anicteric OROPHARYNX: MMM, no exudates, no oropharyngeal erythema or ulceration NECK: supple, no JVD LYMPH:  no palpable lymphadenopathy in the cervical, axillary or inguinal regions LUNGS: clear to auscultation b/l with normal respiratory effort HEART: regular rate & rhythm ABDOMEN:  normoactive bowel sounds , non tender, not distended. Extremity: no pedal edema PSYCH: alert & oriented x 3 with fluent speech NEURO: no focal motor/sensory deficits    LABORATORY DATA:  I have reviewed the data as listed  . CBC Latest Ref Rng & Units 05/19/2018 02/02/2018 01/16/2018  WBC 4.0 - 10.3 K/uL 4.0 8.5 6.9  Hemoglobin 13.0 - 17.1 g/dL 40.9 81.1 91.4  Hematocrit 38.4 - 49.9 % 48.2 45.4 46.6  Platelets 140 - 400 K/uL 186 219 89(L)    . CMP Latest Ref Rng & Units 05/19/2018 02/02/2018  Glucose 70 - 140 mg/dL 91 95  BUN 7 - 26 mg/dL 14 16  Creatinine 7.82 - 1.30 mg/dL 9.56 2.13  Sodium 086 - 145 mmol/L 144 143  Potassium 3.5 - 5.1 mmol/L 4.5 4.1  Chloride 98 - 109 mmol/L 108 107  CO2 22 - 29 mmol/L 26 25  Calcium 8.4 - 10.4 mg/dL 57.8 46.9  Total Protein 6.4 - 8.3 g/dL 7.3 7.6  Total Bilirubin 0.2 - 1.2 mg/dL 6.2(X) 5.2(W)  Alkaline Phos 40 - 150 U/L 62 81  AST 5 - 34 U/L 23 18  ALT 0 - 55 U/L 22 22   Component     Latest Ref Rng & Units 02/02/2018 05/19/2018  LDH  125 - 245  U/L 149 138    Component     Latest Ref Rng & Units 02/02/2018  CRP     <1.0 mg/dL 1.4 (H)  Sed Rate     0 - 16 mm/hr 5  Hep B S Ab      Non Reactive  Hepatitis B Surface Ag     Negative Negative  HIV Screen 4th Generation wRfx     Non Reactive Non Reactive  HCV Ab     0.0 - 0.9 s/co ratio <0.1  LDH     125 - 245 U/L 149     01/18/18 Soft Tissue Mass Pathology:    01/18/18 Tissue Flow Cytometry:   02/02/18 Flow Cytometry:   RADIOGRAPHIC STUDIES: I have personally reviewed the radiological images as listed and agreed with the findings in the report.  CT soft tissue neck (10/24/2018) CT NECK WITH CONTRAST  TECHNIQUE: Multidetector CT imaging of the neck was performed using the standard protocol following the bolus administration of intravenous contrast.  CONTRAST:  75mL ISOVUE-300 IOPAMIDOL (ISOVUE-300) INJECTION 61%  COMPARISON:  Neck ultrasound 10/13/2017  FINDINGS: Pharynx and larynx:  --Nasopharynx: Fossae of Rosenmuller are clear. Normal adenoid tonsils for age.  --Oral cavity and oropharynx: The palatine and lingual tonsils are normal. The visible oral cavity and floor of mouth are normal.  --Hypopharynx: Normal vallecula and pyriform sinuses.  --Larynx: Normal epiglottis and pre-epiglottic space. Normal aryepiglottic and vocal folds.  --Retropharyngeal space: No abscess, effusion or lymphadenopathy.  Salivary glands:  --Parotid: No mass lesion or inflammation. No sialolithiasis or ductal dilatation.  --Submandibular: Symmetric without inflammation. No sialolithiasis or ductal dilatation.  --Sublingual: Normal. No ranula or other visible lesion of the base of tongue and floor of mouth.  Thyroid: Normal.  Lymph nodes: The palpable area of concern corresponds to an enlarged right level IIa cervical lymph node that measures 3.6 x 2.3 x 3.9 cm (AP x Transverse x CC). A slightly more posterior level IIa node measures 11 mm. No  contralateral pathologically enlarged or abnormal density nodes.  Vascular: Major cervical vessels are patent.  Limited intracranial: Normal.  Visualized orbits: Normal.  Mastoids and visualized paranasal sinuses: No fluid levels or advanced mucosal thickening. No mastoid effusion.  Skeleton: No bony spinal canal stenosis. No lytic or blastic lesions.  Upper chest: Clear.  Other: None.  IMPRESSION: 1. Massively enlarged right level IIa cervical lymph node measuring 3.6 x 2.3 x 3.9 cm, corresponding to the palpable area of concern. Histologic sampling should be considered to assess for possible neoplastic or lymphoproliferative origin. 2. Multiple smaller, adjacent right cervical lymph nodes. 3. No focal abnormality of the pharynx, larynx or salivary glands.   Electronically Signed   By: Deatra Robinson M.D.   On: 10/24/2017 20:50  ASSESSMENT & PLAN:   28 y.o. male with  1. Rt Cervical Lymphadenopathy - florid Marginal Zone hyperplasia, There was concern for Pediatric type Marginal Zone lymphoma but no evidence of clonality on flow. IHC, gene reaarangement studies and NGS  No overt constitutional symptoms. No anemia. Mild thrombocytopenia - now resolved HCV neg. HIV neg PET/CT- no other overt evidence of disease. B/l tonsillary uptake likely from recent infection. Low grade uptake in BM - likely from recent infection. No cytopenia or evidence of lymphoma in LN to suggest LN involvement or warrant bone marrow.  2. Thrombocytopenia ? Related to lymphoma vs medications (NSAIDS) Appears likely related to NSAIDS or passing viral infection. Rpt labs today show resolution of  thrombocytopenia.  3. Macular rash over penis/trunk and wrist -- scaling and resolving now. Lichen planus per bx Had previous chest wall rash a few years ago and was biopsied. PLAN -Discussed pt labwork today, 05/19/18; blood counts and chemistries are stable -Will order CT C/A/P prior to next  visit in 4 months -No palpable lymphadenopathy in exam -Will continue to clinically monitor tonsils which appear to have some chronic inflammation -Previous lymph node continues to appear to have been reactive -Pt will let me know if any new or concerning symptoms -Will see pt back in 4 months  . Orders Placed This Encounter  Procedures  . CT Soft Tissue Neck W Contrast    Standing Status:   Future    Standing Expiration Date:   05/19/2019    Order Specific Question:   If indicated for the ordered procedure, I authorize the administration of contrast media per Radiology protocol    Answer:   Yes    Order Specific Question:   Preferred imaging location?    Answer:   [facility]    Order Specific Question:   Radiology Contrast Protocol - do NOT remove file path    Answer:   [email or web address]    Order Specific Question:   Reason for Exam additional comments    Answer:   cervical lymphadenopathy with ?pediatric type follicular lymphoma vs reactive change for f/u  . CT Chest W Contrast    Standing Status:   Future    Standing Expiration Date:   05/19/2019    Order Specific Question:   If indicated for the ordered procedure, I authorize the administration of contrast media per Radiology protocol    Answer:   Yes    Order Specific Question:   Preferred imaging location?    Answer:   [facility]    Order Specific Question:   Radiology Contrast Protocol - do NOT remove file path    Answer:   [email or web address]    Order Specific Question:   Reason for Exam additional comments    Answer:   cervical lymphadenopathy with ?pediatric type follicular lymphoma vs reactive change for f/u  . CT Abdomen Pelvis W Contrast    Standing Status:   Future    Standing Expiration Date:   05/19/2019    Order Specific Question:   If indicated for the ordered procedure, I authorize the administration of contrast media per Radiology protocol     Answer:   Yes    Order Specific Question:   Preferred imaging location?    Answer:   [facility]    Order Specific Question:   Is Oral Contrast requested for this exam?    Answer:   Yes, Per Radiology protocol    Order Specific Question:   Radiology Contrast Protocol - do NOT remove file path    Answer:   [email or web address]    Order Specific Question:   Reason for Exam additional comments    Answer:   cervical lymphadenopathy with ?pediatric type follicular lymphoma vs reactive change for f/u  . CBC with Differential/Platelet    Standing Status:   Future    Standing Expiration Date:   06/23/2019  . CMP (Cancer Center only)    Standing Status:   Future    Standing Expiration Date:   05/20/2019  . Lactate dehydrogenase    Standing Status:   Future    Standing Expiration Date:   05/20/2019    CT neck/chest/abd in 16 weeks RTC with  Dr Candise Che in 4 months with labs CT    All of the patients and mulitple family members questions were answered with apparent satisfaction. The patient knows to call the clinic with any problems, questions or concerns.  The toal time spent in the appt was 20 minutes and more than 50% was on counseling and direct patient cares.      Wyvonnia Lora MD MS AAHIVMS [facility] Hematology/Oncology Physician [facility]  (Office):       [phone] (Work cell):  [phone] (Fax):           [phone]  05/19/2018 10:42 AM  I, Marcelline Mates, am acting as a Neurosurgeon for Dr Candise Che.   .I have reviewed the above documentation for accuracy and completeness, and I agree with the above. Johney Maine MD

## 2018-05-19 ENCOUNTER — Telehealth: Payer: Self-pay | Admitting: Hematology

## 2018-05-19 ENCOUNTER — Inpatient Hospital Stay: Payer: BLUE CROSS/BLUE SHIELD

## 2018-05-19 ENCOUNTER — Inpatient Hospital Stay: Payer: BLUE CROSS/BLUE SHIELD | Attending: Hematology | Admitting: Hematology

## 2018-05-19 VITALS — BP 120/65 | HR 52 | Temp 98.1°F | Resp 17 | Ht 69.75 in | Wt 163.8 lb

## 2018-05-19 DIAGNOSIS — R221 Localized swelling, mass and lump, neck: Secondary | ICD-10-CM

## 2018-05-19 DIAGNOSIS — Z79899 Other long term (current) drug therapy: Secondary | ICD-10-CM | POA: Insufficient documentation

## 2018-05-19 DIAGNOSIS — R590 Localized enlarged lymph nodes: Secondary | ICD-10-CM

## 2018-05-19 DIAGNOSIS — L439 Lichen planus, unspecified: Secondary | ICD-10-CM | POA: Diagnosis not present

## 2018-05-19 DIAGNOSIS — R070 Pain in throat: Secondary | ICD-10-CM | POA: Diagnosis not present

## 2018-05-19 DIAGNOSIS — R531 Weakness: Secondary | ICD-10-CM | POA: Insufficient documentation

## 2018-05-19 DIAGNOSIS — D696 Thrombocytopenia, unspecified: Secondary | ICD-10-CM | POA: Insufficient documentation

## 2018-05-19 DIAGNOSIS — R591 Generalized enlarged lymph nodes: Secondary | ICD-10-CM

## 2018-05-19 LAB — CMP (CANCER CENTER ONLY)
ALT: 22 U/L (ref 0–55)
ANION GAP: 10 (ref 3–11)
AST: 23 U/L (ref 5–34)
Albumin: 4.7 g/dL (ref 3.5–5.0)
Alkaline Phosphatase: 62 U/L (ref 40–150)
BUN: 14 mg/dL (ref 7–26)
CALCIUM: 10.2 mg/dL (ref 8.4–10.4)
CO2: 26 mmol/L (ref 22–29)
CREATININE: 1.08 mg/dL (ref 0.70–1.30)
Chloride: 108 mmol/L (ref 98–109)
Glucose, Bld: 91 mg/dL (ref 70–140)
Potassium: 4.5 mmol/L (ref 3.5–5.1)
SODIUM: 144 mmol/L (ref 136–145)
Total Bilirubin: 1.4 mg/dL — ABNORMAL HIGH (ref 0.2–1.2)
Total Protein: 7.3 g/dL (ref 6.4–8.3)

## 2018-05-19 LAB — CBC WITH DIFFERENTIAL/PLATELET
BASOS ABS: 0 10*3/uL (ref 0.0–0.1)
BASOS PCT: 1 %
EOS ABS: 0.3 10*3/uL (ref 0.0–0.5)
EOS PCT: 7 %
HCT: 48.2 % (ref 38.4–49.9)
Hemoglobin: 16.4 g/dL (ref 13.0–17.1)
Lymphocytes Relative: 37 %
Lymphs Abs: 1.5 10*3/uL (ref 0.9–3.3)
MCH: 29.8 pg (ref 27.2–33.4)
MCHC: 34 g/dL (ref 32.0–36.0)
MCV: 87.6 fL (ref 79.3–98.0)
Monocytes Absolute: 0.4 10*3/uL (ref 0.1–0.9)
Monocytes Relative: 10 %
NEUTROS PCT: 45 %
Neutro Abs: 1.8 10*3/uL (ref 1.5–6.5)
PLATELETS: 186 10*3/uL (ref 140–400)
RBC: 5.5 MIL/uL (ref 4.20–5.82)
RDW: 13 % (ref 11.0–14.6)
WBC: 4 10*3/uL (ref 4.0–10.3)

## 2018-05-19 LAB — RETICULOCYTES
RBC.: 5.5 MIL/uL (ref 4.20–5.82)
RETIC COUNT ABSOLUTE: 60.5 10*3/uL (ref 34.8–93.9)
RETIC CT PCT: 1.1 % (ref 0.8–1.8)

## 2018-05-19 LAB — LACTATE DEHYDROGENASE: LDH: 138 U/L (ref 125–245)

## 2018-05-19 NOTE — Telephone Encounter (Signed)
Appointments scheduled contrast provided w/ instructions per 6/14 los

## 2018-09-08 ENCOUNTER — Inpatient Hospital Stay: Payer: BLUE CROSS/BLUE SHIELD | Attending: Hematology

## 2018-09-08 DIAGNOSIS — R590 Localized enlarged lymph nodes: Secondary | ICD-10-CM | POA: Insufficient documentation

## 2018-09-08 DIAGNOSIS — L439 Lichen planus, unspecified: Secondary | ICD-10-CM | POA: Insufficient documentation

## 2018-09-08 DIAGNOSIS — D696 Thrombocytopenia, unspecified: Secondary | ICD-10-CM | POA: Insufficient documentation

## 2018-09-08 DIAGNOSIS — R531 Weakness: Secondary | ICD-10-CM | POA: Diagnosis not present

## 2018-09-08 DIAGNOSIS — M542 Cervicalgia: Secondary | ICD-10-CM | POA: Insufficient documentation

## 2018-09-08 DIAGNOSIS — R221 Localized swelling, mass and lump, neck: Secondary | ICD-10-CM | POA: Diagnosis not present

## 2018-09-08 DIAGNOSIS — R591 Generalized enlarged lymph nodes: Secondary | ICD-10-CM

## 2018-09-08 LAB — CMP (CANCER CENTER ONLY)
ALBUMIN: 4.8 g/dL (ref 3.5–5.0)
ALT: 30 U/L (ref 0–44)
AST: 23 U/L (ref 15–41)
Alkaline Phosphatase: 61 U/L (ref 38–126)
Anion gap: 9 (ref 5–15)
BILIRUBIN TOTAL: 1.8 mg/dL — AB (ref 0.3–1.2)
BUN: 11 mg/dL (ref 6–20)
CHLORIDE: 107 mmol/L (ref 98–111)
CO2: 26 mmol/L (ref 22–32)
Calcium: 10.2 mg/dL (ref 8.9–10.3)
Creatinine: 0.97 mg/dL (ref 0.61–1.24)
GFR, Est AFR Am: >60 mL/min (ref >60)
GLUCOSE: 94 mg/dL (ref 70–99)
POTASSIUM: 4.3 mmol/L (ref 3.5–5.1)
Sodium: 142 mmol/L (ref 135–145)
Total Protein: 7.8 g/dL (ref 6.5–8.1)

## 2018-09-08 LAB — CBC WITH DIFFERENTIAL/PLATELET
BASOS ABS: 0.1 10*3/uL (ref 0.0–0.1)
Basophils Relative: 1 %
Eosinophils Absolute: 0.4 10*3/uL (ref 0.0–0.5)
Eosinophils Relative: 9 %
HEMATOCRIT: 48.1 % (ref 38.4–49.9)
Hemoglobin: 16.6 g/dL (ref 13.0–17.1)
LYMPHS PCT: 35 %
Lymphs Abs: 1.7 10*3/uL (ref 0.9–3.3)
MCH: 30.1 pg (ref 27.2–33.4)
MCHC: 34.5 g/dL (ref 32.0–36.0)
MCV: 87.1 fL (ref 79.3–98.0)
Monocytes Absolute: 0.4 10*3/uL (ref 0.1–0.9)
Monocytes Relative: 9 %
NEUTROS ABS: 2.2 10*3/uL (ref 1.5–6.5)
NEUTROS PCT: 46 %
Platelets: 195 10*3/uL (ref 140–400)
RBC: 5.52 MIL/uL (ref 4.20–5.82)
RDW: 13.2 % (ref 11.0–14.6)
WBC: 4.8 10*3/uL (ref 4.0–10.3)

## 2018-09-08 LAB — LACTATE DEHYDROGENASE: LDH: 141 U/L (ref 98–192)

## 2018-09-15 ENCOUNTER — Encounter: Payer: Self-pay | Admitting: Hematology

## 2018-09-15 ENCOUNTER — Inpatient Hospital Stay (HOSPITAL_BASED_OUTPATIENT_CLINIC_OR_DEPARTMENT_OTHER): Payer: BLUE CROSS/BLUE SHIELD | Admitting: Hematology

## 2018-09-15 VITALS — BP 139/77 | HR 51 | Temp 97.5°F | Resp 18 | Ht 69.75 in | Wt 167.6 lb

## 2018-09-15 DIAGNOSIS — D696 Thrombocytopenia, unspecified: Secondary | ICD-10-CM

## 2018-09-15 DIAGNOSIS — R590 Localized enlarged lymph nodes: Secondary | ICD-10-CM

## 2018-09-15 DIAGNOSIS — M542 Cervicalgia: Secondary | ICD-10-CM

## 2018-09-15 DIAGNOSIS — R531 Weakness: Secondary | ICD-10-CM

## 2018-09-15 DIAGNOSIS — L439 Lichen planus, unspecified: Secondary | ICD-10-CM

## 2018-09-15 NOTE — Progress Notes (Signed)
HEMATOLOGY/ONCOLOGY CONSULTATION NOTE  Date of Service: 09/15/2018  Patient Care Team: Patient, No Pcp Per as PCP - General (General Practice)  CHIEF COMPLAINTS/PURPOSE OF CONSULTATION:  Cervical Lymphadenopathy   HISTORY OF PRESENTING ILLNESS:   Jose Schwartz is a wonderful 27 y.o. male who has been referred to [place] by ENT Dr. 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 He is accompanied today by his parents and girlfriend. The pt reports that he is doing well overall.   The pt notes that he has generally been healthy and has not had any medical problems. He first noted a lump on his right neck a year ago, which initially grew very slowly and noted that he felt completely fine.  The rt neck lump gradually become more noticeable and he sought further evaluation.   On 10/24/17 the pt had a CT Soft Tissue Neck revealing 1. Massively enlarged right level IIa cervical lymph node measuring 3.6 x 2.3 x 3.9 cm, corresponding to the palpable area of concern. Histologic sampling should be considered to assess for possible neoplastic or lymphoproliferative origin. 2. Multiple smaller, adjacent right cervical lymph nodes. 3. No focal abnormality of the pharynx, larynx or salivary glands.   The pt had a US guided core biopsy on 11/15/17 showing prominent B cell population with atypical lymphoid proliferation without a definitive diagnosis of lymphoma.   The pt has had excisional biopsy of a right superior lateral neck mass completed on 01/18/18 revealing an abnormal lymphoid proliferation.  Most recent lab results (01/16/18) of CBC  is as follows: all values are WNL except for Platelets at 89k.  He notes that in June 2018 he got a minor cut on his lower right leg that became infected and took antibiotics. He notes that in the last two months he received US guided biopsy, and an excisional biopsy.   He notes that about 6 weeks ago he noticed a discrete macular  dot like rash on his bilateral flanks, under his bilateral arms, wrists, and on his genitals. He then presented to the urgent care on [facility] where he had an infection work up and was prescribed Fluconazole and anti-fungal cream that did not clear up his rash. He notes that his rash appeared over a day or two and  that he did not associate its appearance with anything in particular; he notes being very active in the gym and playing basketball and believed his rash to be sports related. He notes not being sure where his rash began first, and that it persists today.   He denies having any animal contact nor owning any pets. He notes that the spots have just started flaking. He notes that the spot on his penis first appeared as bright red but has been resolving slowly. He has stopped using his anti-fungal cream.   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   He notes that about 4 years ago he had a chest rash for which we had a skin biopsy, that was unrevealing and his rash cleared up. He was seen at [facility] in [place]. He notes that his dots were white colored and looked similarly to pimples.  He took a medication that he used in the shower and is not sure what it was.  He notes that he had a ganglion cyst on his right wrist in the past.   He notes that two  days ago his throat became sore and also felt weak and felt some chills two nights ago which he treated with Aleve and cough drops. He notes currently feeling weak. He denies taking NSAIDs regularly outside of the last two days. He denies taking steroids or prednisone anytime in the recent past.  On review of systems, pt reports weakness, throat pain, and denies ear aches, fevers, chills, night sweats over the last year, other lumps or bumps, testicular pain or swelling, penal discharge, pain along the spine, abdominal pain, and any other symptoms.  On  PMHx the pt reports having tubes in his ears when he was little, recurrent cold sores, denies unsafe sexual exposure and denies needle or drug use. On Social Hx the pt reports no history of smoking, and works as a Pharmacist, hospital.  On Family Hx the pt reports his mother and her family have had psoriasis and thyroid issues. His father's side has had eczema problems.   Interval History:  Jose Schwartz returns today regarding his cervical LNadenopathy showing atypical nodal marginal zone hyperplasia. The patient's last visit with [place] was on 05/19/18. He is accompanied today by his friend. The pt reports that he is doing well overall.   The pt reports that he has had some neck pain on the right side of his neck, which hurts most when he bench presses. He is currently lifting weights actively three times a week. The pt is also anticipating removal of his wisdom teeth soon. The pt denies any other concerns and endorses good energy levels. The pt also notes that his previous skin rash under his right armpit completely resolved. He denies any constitutional symptoms.   Lab results today (09/15/18) of CBC w/diff, CMP is as follows: all values are WNL except for Total Bilirubin at 1.8. 09/15/18 LDH at 141  On review of systems, pt reports some right sided neck pain, good energy levels, staying very active, and denies fevers, chills, night sweats, unexpected weight loss, SOB, abdominal pains, leg swelling, noticing any new lumps or bumps, new fatigue, skin rashes, testicular pain or swelling, back pains, and any other symptoms.   MEDICAL HISTORY:  History reviewed. No pertinent past medical history.  SURGICAL HISTORY: Past Surgical History:  Procedure Laterality Date  . ADENOIDECTOMY W/ MYRINGOTOMY  2003  . EXCISION MASS NECK Right 01/18/2018   Procedure: EXCISION MASS NECK;  Surgeon: Osborn Coho, MD;  Location: [facility] OR;  Service: ENT;  Laterality: Right;    SOCIAL HISTORY: Social History    Socioeconomic History  . Marital status: Single    Spouse name: Not on file  . Number of children: Not on file  . Years of education: Not on file  . Highest education level: Not on file  Occupational History  . Not on file  Social Needs  . Financial resource strain: Not on file  . Food insecurity:    Worry: Not on file    Inability: Not on file  . Transportation needs:    Medical: Not on file    Non-medical: Not on file  Tobacco Use  . Smoking status: Never Smoker  . Smokeless tobacco: Never Used  Substance and Sexual Activity  . Alcohol use: Yes    Alcohol/week: 2.0 standard drinks    Types: 2 Cans of beer per week    Comment: socially- 2 drinks per week   . Drug use: No  . Sexual activity: Not on file  Lifestyle  . Physical activity:  Days per week: Not on file    Minutes per session: Not on file  . Stress: Not on file  Relationships  . Social connections:    Talks on phone: Not on file    Gets together: Not on file    Attends religious service: Not on file    Active member of club or organization: Not on file    Attends meetings of clubs or organizations: Not on file    Relationship status: Not on file  . Intimate partner violence:    Fear of current or ex partner: Not on file    Emotionally abused: Not on file    Physically abused: Not on file    Forced sexual activity: Not on file  Other Topics Concern  . Not on file  Social History Narrative  . Not on file    FAMILY HISTORY: History reviewed. No pertinent family history.  ALLERGIES:  has No Known Allergies.  MEDICATIONS:  Current Outpatient Medications  Medication Sig Dispense Refill  . Multiple Vitamins-Minerals (ADULT GUMMY PO) Take 2 tablets by mouth daily.     No current facility-administered medications for this visit.     REVIEW OF SYSTEMS:    A 10+ POINT REVIEW OF SYSTEMS WAS OBTAINED including neurology, dermatology, psychiatry, cardiac, respiratory, lymph, extremities, GI, GU,  Musculoskeletal, constitutional, breasts, reproductive, HEENT.  All pertinent positives are noted in the HPI.  All others are negative.   PHYSICAL EXAMINATION: ECOG PERFORMANCE STATUS: 0 - Asymptomatic  . Vitals:   09/15/18 1120  BP: 139/77  Pulse: (!) 51  Resp: 18  Temp: (!) 97.5 F (36.4 C)  SpO2: 100%   Filed Weights   09/15/18 1120  Weight: 167 lb 9.6 oz (76 kg)   .Body mass index is 24.22 kg/m.  GENERAL:alert, in no acute distress and comfortable SKIN: no acute rashes, no significant lesions EYES: conjunctiva are pink and non-injected, sclera anicteric OROPHARYNX: MMM, no exudates, no oropharyngeal erythema or ulceration NECK: supple, no JVD LYMPH:  no palpable lymphadenopathy in the cervical, axillary or inguinal regions LUNGS: clear to auscultation b/l with normal respiratory effort HEART: regular rate & rhythm ABDOMEN:  normoactive bowel sounds , non tender, not distended. No palpable hepatosplenomegaly.  Extremity: no pedal edema PSYCH: alert & oriented x 3 with fluent speech NEURO: no focal motor/sensory deficits   LABORATORY DATA:  I have reviewed the data as listed  . CBC Latest Ref Rng & Units 09/08/2018 05/19/2018 02/02/2018  WBC 4.0 - 10.3 K/uL 4.8 4.0 8.5  Hemoglobin 13.0 - 17.1 g/dL 16.1 09.6 04.5  Hematocrit 38.4 - 49.9 % 48.1 48.2 45.4  Platelets 140 - 400 K/uL 195 186 219    . CMP Latest Ref Rng & Units 09/08/2018 05/19/2018 02/02/2018  Glucose 70 - 99 mg/dL 94 91 95  BUN 6 - 20 mg/dL 11 14 16   Creatinine 0.61 - 1.24 mg/dL 4.09 8.11 9.14  Sodium 135 - 145 mmol/L 142 144 143  Potassium 3.5 - 5.1 mmol/L 4.3 4.5 4.1  Chloride 98 - 111 mmol/L 107 108 107  CO2 22 - 32 mmol/L 26 26 25   Calcium 8.9 - 10.3 mg/dL 78.2 95.6 21.3  Total Protein 6.5 - 8.1 g/dL 7.8 7.3 7.6  Total Bilirubin 0.3 - 1.2 mg/dL 0.8(M) 5.7(Q) 4.6(N)  Alkaline Phos 38 - 126 U/L 61 62 81  AST 15 - 41 U/L 23 23 18   ALT 0 - 44 U/L 30 22 22    Component  Latest Ref Rng & Units  02/02/2018 05/19/2018  LDH     125 - 245 U/L 149 138    Component     Latest Ref Rng & Units 02/02/2018  CRP     <1.0 mg/dL 1.4 (H)  Sed Rate     0 - 16 mm/hr 5  Hep B S Ab      Non Reactive  Hepatitis B Surface Ag     Negative Negative  HIV Screen 4th Generation wRfx     Non Reactive Non Reactive  HCV Ab     0.0 - 0.9 s/co ratio <0.1  LDH     125 - 245 U/L 149     01/18/18 Soft Tissue Mass Pathology:    01/18/18 Tissue Flow Cytometry:   02/02/18 Flow Cytometry:   RADIOGRAPHIC STUDIES: I have personally reviewed the radiological images as listed and agreed with the findings in the report.  CT soft tissue neck (10/24/2018) CT NECK WITH CONTRAST  TECHNIQUE: Multidetector CT imaging of the neck was performed using the standard protocol following the bolus administration of intravenous contrast.  CONTRAST:  75mL ISOVUE-300 IOPAMIDOL (ISOVUE-300) INJECTION 61%  COMPARISON:  Neck ultrasound 10/13/2017  FINDINGS: Pharynx and larynx:  --Nasopharynx: Fossae of Rosenmuller are clear. Normal adenoid tonsils for age.  --Oral cavity and oropharynx: The palatine and lingual tonsils are normal. The visible oral cavity and floor of mouth are normal.  --Hypopharynx: Normal vallecula and pyriform sinuses.  --Larynx: Normal epiglottis and pre-epiglottic space. Normal aryepiglottic and vocal folds.  --Retropharyngeal space: No abscess, effusion or lymphadenopathy.  Salivary glands:  --Parotid: No mass lesion or inflammation. No sialolithiasis or ductal dilatation.  --Submandibular: Symmetric without inflammation. No sialolithiasis or ductal dilatation.  --Sublingual: Normal. No ranula or other visible lesion of the base of tongue and floor of mouth.  Thyroid: Normal.  Lymph nodes: The palpable area of concern corresponds to an enlarged right level IIa cervical lymph node that measures 3.6 x 2.3 x 3.9 cm (AP x Transverse x CC). A slightly more  posterior level IIa node measures 11 mm. No contralateral pathologically enlarged or abnormal density nodes.  Vascular: Major cervical vessels are patent.  Limited intracranial: Normal.  Visualized orbits: Normal.  Mastoids and visualized paranasal sinuses: No fluid levels or advanced mucosal thickening. No mastoid effusion.  Skeleton: No bony spinal canal stenosis. No lytic or blastic lesions.  Upper chest: Clear.  Other: None.  IMPRESSION: 1. Massively enlarged right level IIa cervical lymph node measuring 3.6 x 2.3 x 3.9 cm, corresponding to the palpable area of concern. Histologic sampling should be considered to assess for possible neoplastic or lymphoproliferative origin. 2. Multiple smaller, adjacent right cervical lymph nodes. 3. No focal abnormality of the pharynx, larynx or salivary glands.   Electronically Signed   By: Deatra Robinson M.D.   On: 10/24/2017 20:50  ASSESSMENT & PLAN:   27 y.o. male with  1. Rt Cervical Lymphadenopathy - florid Marginal Zone hyperplasia, There was concern for Pediatric type Marginal Zone lymphoma but no evidence of clonality on flow. IHC, gene reaarangement studies and NGS  No overt constitutional symptoms. No anemia. Mild thrombocytopenia - now resolved HCV neg. HIV neg PET/CT- no other overt evidence of disease. B/l tonsillary uptake likely from recent infection. Low grade uptake in BM - likely from recent infection. No cytopenia or evidence of lymphoma in LN to suggest LN involvement or warrant bone marrow.  2. Thrombocytopenia ? Related to lymphoma vs medications (NSAIDS) Appears likely  related to NSAIDS or passing viral infection. Rpt labs today show resolution of thrombocytopenia.  3. Macular rash over penis/trunk and wrist -- scaling and resolving now. Lichen planus per bx Had previous chest wall rash a few years ago and was biopsied.  PLAN -No palpable lymphadenopathy in exam -Previous lymph node continues  to appear to have been reactive -Discussed pt labwork today, 09/15/18; blood counts and chemistries are stable, LDH is normal at 141.  -Previously ordered CT C/A/P and Neck were not scheduled and completed. Pt has elected to not pursue these after clinic which is reasonable at this time.  -Recommend warm compresses for neck strain -Discussed that the pt has not shown any lab or clinical concern of disease and that he could choose to see a PCP from now on for routine evaluation, as clinical suspicion is very low  -The pt notes that he will establish care with a PCP -Recommended that the pt establish care with a PCP, and let me know if he has any new concerns  . No orders of the defined types were placed in this encounter.   RTC with Dr Candise Che on an as needed basis     All of the patients and mulitple family members questions were answered with apparent satisfaction. The patient knows to call the clinic with any problems, questions or concerns.  The total time spent in the appt was 20 minutes and more than 50% was on counseling and direct patient cares.     Wyvonnia Lora MD MS AAHIVMS [facility] Hematology/Oncology Physician [facility]  (Office):       [phone] (Work cell):  [phone] (Fax):           [phone]  09/15/2018 12:18 PM  I, Marcelline Mates, am acting as a scribe for Dr. Candise Che  .I have reviewed the above documentation for accuracy and completeness, and I agree with the above. Johney Maine MD

## 2018-09-18 ENCOUNTER — Telehealth: Payer: Self-pay

## 2018-09-18 NOTE — Telephone Encounter (Signed)
RTC with Dr Candise Che on an as needed basis. Per 10/11 los

## 2018-10-14 IMAGING — CT NM PET TUM IMG INITIAL (PI) SKULL BASE T - THIGH
7 of 8 series · 23 of 25 positions shown · non-contrast
Comparison: CT neck 10/24/2017.

CLINICAL DATA: Initial treatment strategy for lymphoma.

EXAM:
NUCLEAR MEDICINE PET SKULL BASE TO THIGH
TECHNIQUE: 7.78 mCi F-18 FDG was injected intravenously. Full-ring PET imaging
was performed from the skull base to thigh after the radiotracer. CT
data was obtained and used for attenuation correction and anatomic
localization.
Fasting blood glucose: 96 mg/dl
Mediastinal blood pool activity: SUV max
Liver blood pool activity: SUV max

[Series 3: pet sk_thigh ac · axial · 5.0mm · 4.07mm/px · z∈[-1480,-524]mm · 6 of 240 slices shown]
[im 1/240]
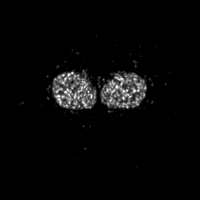
[im 48/240]
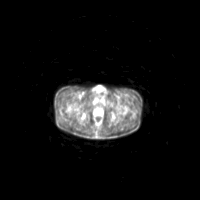
[im 96/240]
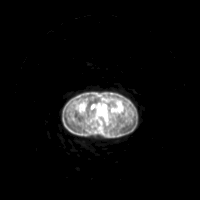
[im 144/240]
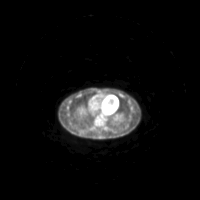
[im 192/240]
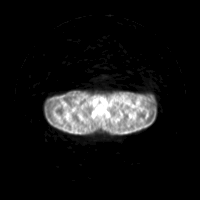
[im 240/240]
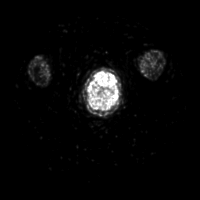

[Series 4: ct sk_thigh 5.0 b31f · axial · 5.0mm · 0.96mm/px · z∈[-1244,-524]mm · 4 of 240 slices shown]
[im 60/240]
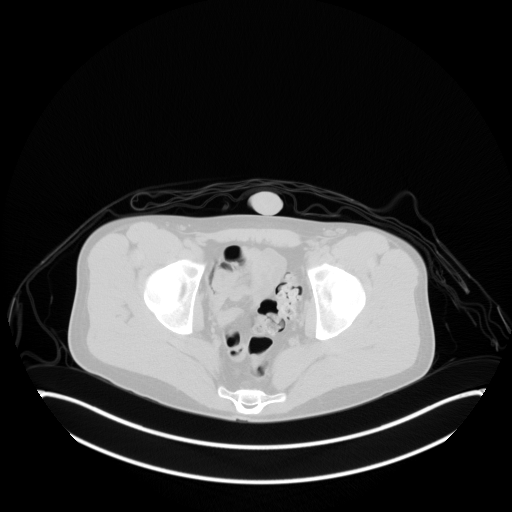
[im 120/240]
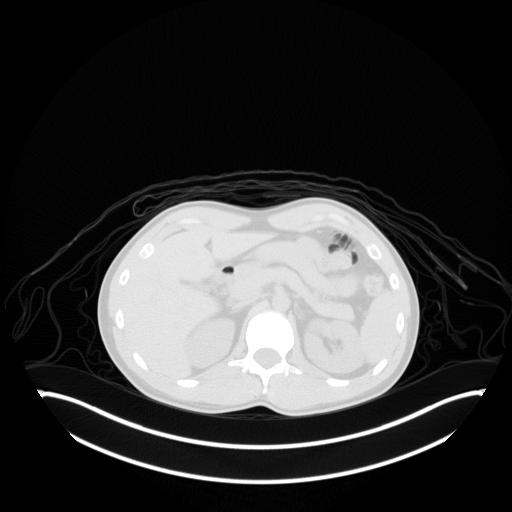
[im 180/240]
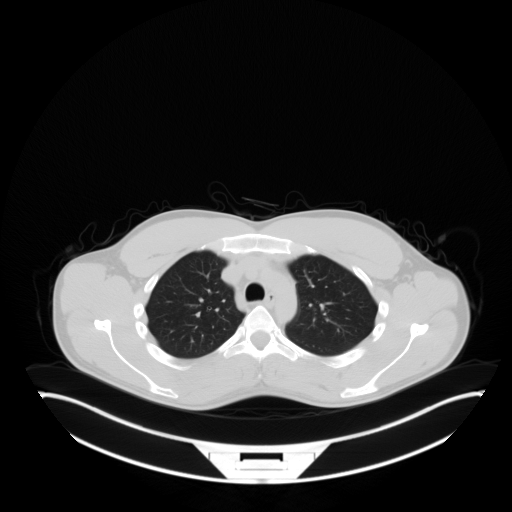
[im 240/240  brain]
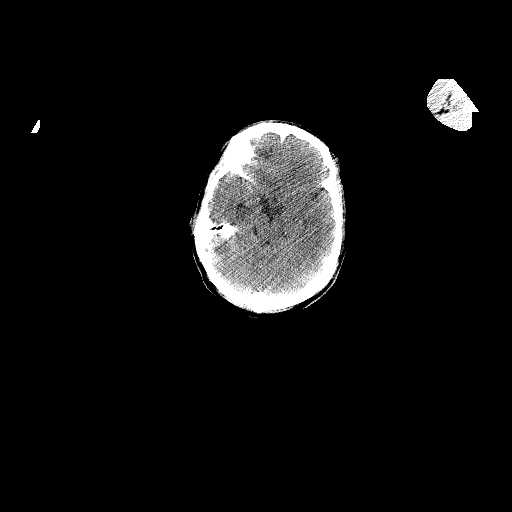

[Series 5: pet sk_thigh nac · axial · 5.0mm · 4.07mm/px · z∈[-1480,-524]mm · 5 of 240 slices shown]
[im 1/240]
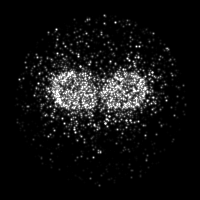
[im 60/240]
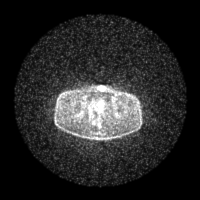
[im 120/240]
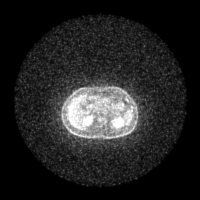
[im 180/240]
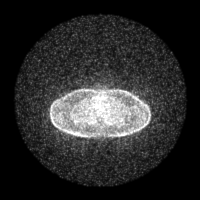
[im 240/240]
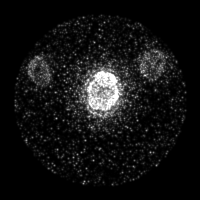

[Series 8: ct sk_thigh 5.0 b70f lung_bone · axial · 5.0mm · 0.62mm/px · 1 of 67 slices shown]
[im 1/67  bone]
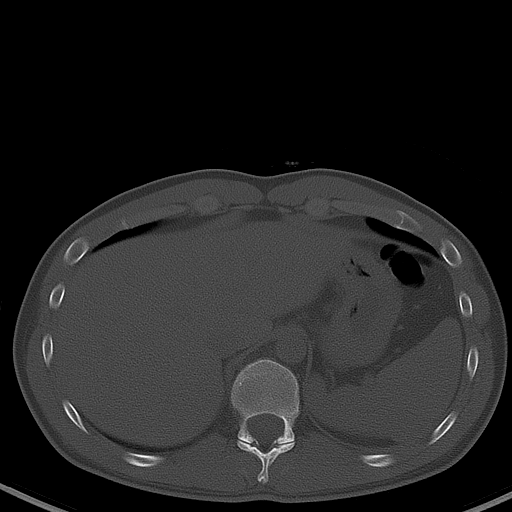

[Series 603: range-ct sk_thigh 5.0 (id)<alpha range> · 1 of 64 slices shown (1 of 2)]
[im 1/64]
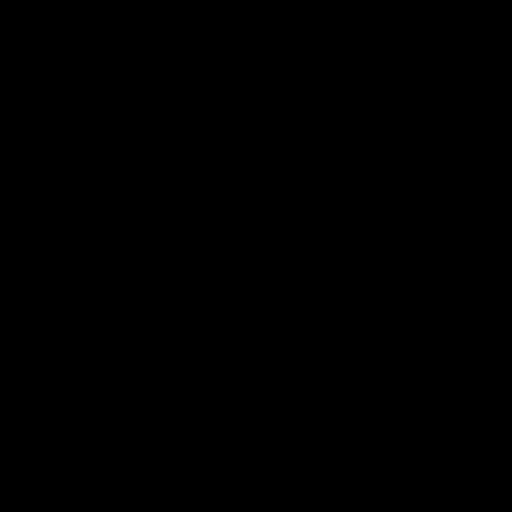

[Series 605: range-ct sk_thigh 5.0 (id)<alpha range> · 5 of 224 slices shown (2 of 2)]
[im 1/224]
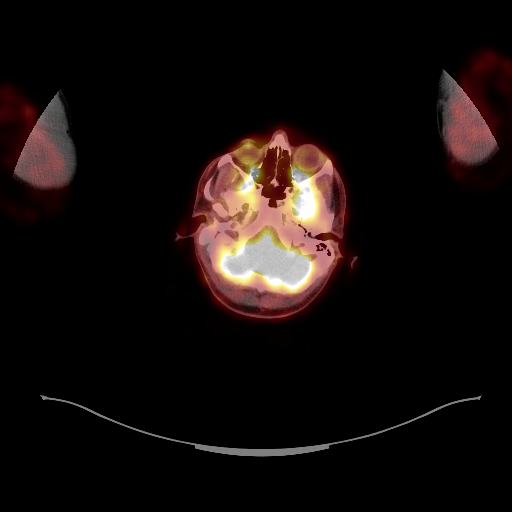
[im 56/224]
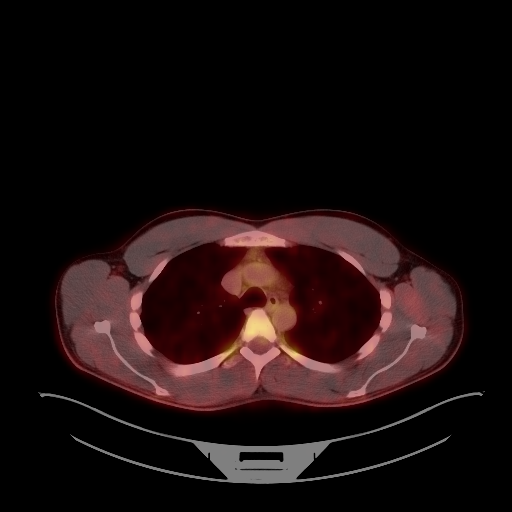
[im 112/224]
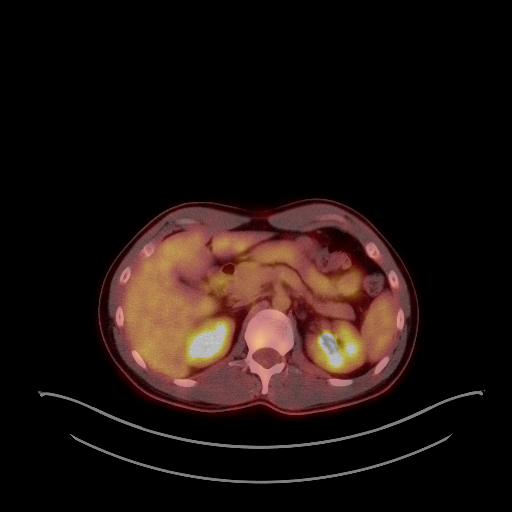
[im 168/224]
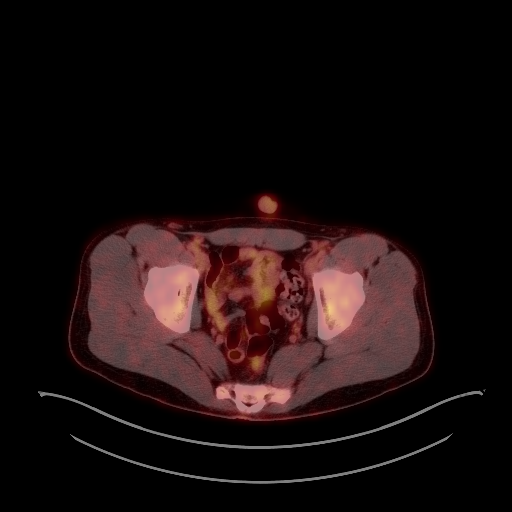
[im 224/224]
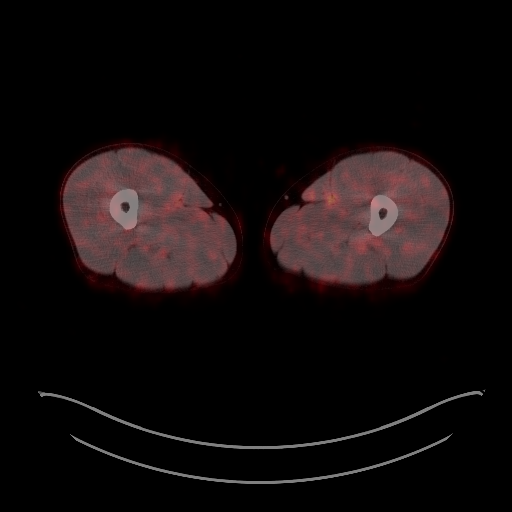

[Series 1069: results mm oncology reading · 1.0mm · 0.44mm/px · 1 of 11 slices shown]
[im 1/11]
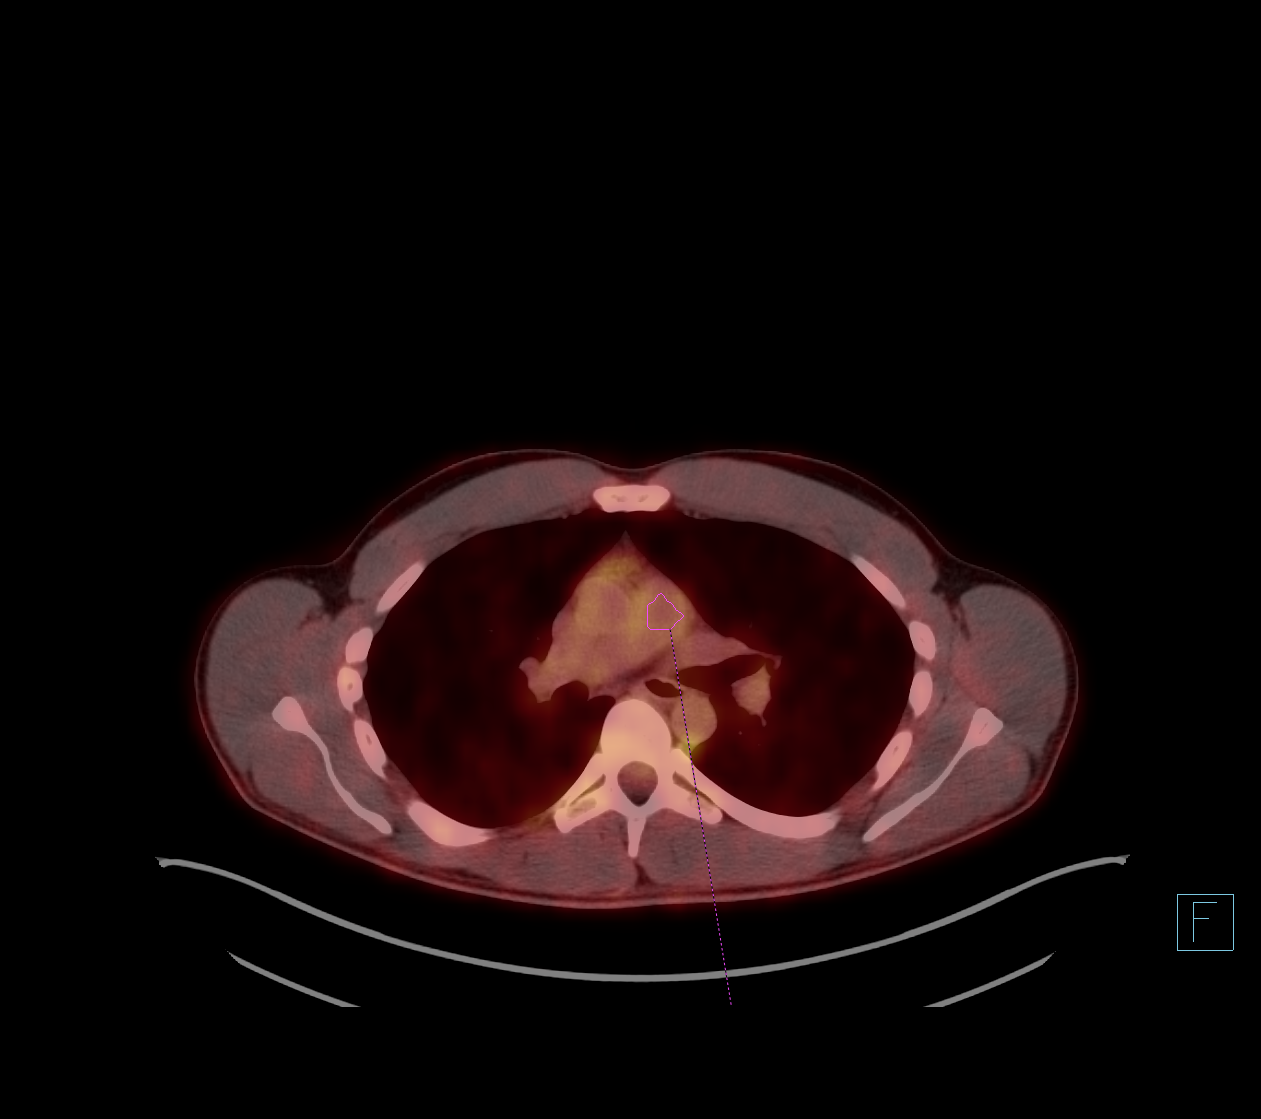

[23 of 25 positions shown; findings below may reference images not displayed]

FINDINGS: NECK: The right level 2 nodal mass has been resected. The SUV max at
the cervical resection site is equal to 4.47, [HOSPITAL] criteria 4.
There is intense radiotracer uptake within bilateral tonsillar
pillars within SUV max of 13.6 (on the right) and 10.49 (on the
left), [HOSPITAL] criteria 5.

Incidental CT findings: none

CHEST: No hypermetabolic mediastinal or hilar nodes. No suspicious
pulmonary nodules on the CT scan.

Incidental CT findings: None

ABDOMEN/PELVIS: No abnormal hypermetabolic activity within the
liver, pancreas, adrenal glands, or spleen. No hypermetabolic lymph
nodes in the abdomen or pelvis.

Incidental CT findings: none

SKELETON: Mild diffuse radiotracer uptake is identified throughout
the axial and appendicular skeleton. SUV max in the right iliac bone
is equal to 2.6. SUV max within the lumbar spine is equal to 3.18.
No focal areas of hypermetabolism above bone marrow activity
identified.

Incidental CT findings: The spleen measures 9.9 by 4.8 by 12.4 cm
(volume = 310 cm^3) and has an SUV max equal to 2.37.
IMPRESSION: 1. There is increased radiotracer uptake associated with the
surgical resection site in the right level 2 region of the neck
which is nonspecific [REDACTED] reflect residual tumor or postsurgical
inflammation. Intense radiotracer uptake is associated with
bilateral tonsillar pillars. Cannot rule out lymphoma involvement.
2. No hypermetabolic nodes or soft tissue mass identified within the
chest, abdomen or pelvis.
3. There is mild diffuse increased radiotracer uptake throughout the
axial and appendicular skeleton. Cannot rule out bone marrow
involvement.

## 2018-10-17 ENCOUNTER — Ambulatory Visit
Admission: EM | Admit: 2018-10-17 | Discharge: 2018-10-17 | Disposition: A | Discharge status: Home / Self Care | Payer: BLUE CROSS/BLUE SHIELD | Attending: Physician Assistant | Admitting: Physician Assistant

## 2018-10-17 ENCOUNTER — Other Ambulatory Visit: Payer: Self-pay

## 2018-10-17 DIAGNOSIS — J069 Acute upper respiratory infection, unspecified: Secondary | ICD-10-CM

## 2018-10-17 DIAGNOSIS — R0981 Nasal congestion: Secondary | ICD-10-CM | POA: Diagnosis not present

## 2018-10-17 LAB — RAPID INFLUENZA A&B ANTIGENS: Influenza B (ARMC): NEGATIVE

## 2018-10-17 LAB — RAPID INFLUENZA A&B ANTIGENS (ARMC ONLY): INFLUENZA A (ARMC): NEGATIVE

## 2018-10-17 NOTE — ED Provider Notes (Signed)
[facility]    CSN: [id number] Arrival date & time: 10/17/18  1006     History   Chief Complaint Chief Complaint  Patient presents with  . Nasal Congestion    HPI Jose Schwartz is a 27 y.o. male. Patient presents with 1 day history of nasal congestion with purulent drainage, fatigue, chills, and body aches. Denies fever. Denies cough and sore throat. Admits to history of allergies. Has been taking Sudafed with some relief of congestion. He says he would like to ensure that he does not have the flu today. He has no other complaints/concerns.   HPI  History reviewed. No pertinent past medical history.  Patient Active Problem List   Diagnosis Date Noted  . Neck mass 01/18/2018    Past Surgical History:  Procedure Laterality Date  . ADENOIDECTOMY W/ MYRINGOTOMY  2003  . EXCISION MASS NECK Right 01/18/2018   Procedure: EXCISION MASS NECK;  Surgeon: Osborn Coho, MD;  Location: [facility] OR;  Service: ENT;  Laterality: Right;  . LYMPH NODE BIOPSY         Home Medications    Prior to Admission medications   Medication Sig Start Date End Date Taking? Authorizing Provider  Multiple Vitamins-Minerals (ADULT GUMMY PO) Take 2 tablets by mouth daily.    [provider]    Family History Family History  Problem Relation Age of Onset  . Healthy Mother   . Healthy Father     Social History Social History   Tobacco Use  . Smoking status: Never Smoker  . Smokeless tobacco: Never Used  Substance Use Topics  . Alcohol use: Yes    Alcohol/week: 2.0 standard drinks    Types: 2 Cans of beer per week    Comment: socially- 2 drinks per week   . Drug use: No     Allergies   Patient has no known allergies.   Review of Systems Review of Systems  Constitutional: Positive for chills and fatigue. Negative for diaphoresis and fever.  HENT: Positive for congestion, rhinorrhea and sinus pressure. Negative for ear pain, postnasal drip, sinus pain, sore  throat and trouble swallowing.   Eyes: Negative for discharge and redness.  Respiratory: Negative for cough, shortness of breath and wheezing.   Cardiovascular: Negative for chest pain.  Gastrointestinal: Negative for abdominal pain, nausea and vomiting.  Musculoskeletal: Positive for myalgias. Negative for arthralgias, back pain and neck pain.  Skin: Negative for color change and rash.  Allergic/Immunologic: Positive for environmental allergies.  Neurological: Negative for dizziness, weakness, light-headedness and headaches.  Hematological: Negative for adenopathy.     Physical Exam Triage Vital Signs ED Triage Vitals  Enc Vitals Group     BP 10/17/18 1014 132/81     Pulse Rate 10/17/18 1014 65     Resp 10/17/18 1014 16     Temp 10/17/18 1014 98.5 F (36.9 C)     Temp Source 10/17/18 1014 Oral     SpO2 10/17/18 1014 99 %     Weight 10/17/18 1017 162 lb (73.5 kg)     Height 10/17/18 1017 5' 9.75" (1.772 m)     Head Circumference --      Peak Flow --      Pain Score 10/17/18 1017 0     Pain Loc --      Pain Edu? --      Excl. in GC? --    No data found.  Updated Vital Signs BP 132/81 (BP Location: Left Arm)  Pulse 65   Temp 98.5 F (36.9 C) (Oral)   Resp 16   Ht 5' 9.75" (1.772 m)   Wt 162 lb (73.5 kg)   SpO2 99%   BMI 23.41 kg/m       Physical Exam  Constitutional: He is oriented to person, place, and time. He appears well-developed and well-nourished. No distress.  HENT:  Head: Normocephalic and atraumatic.  Right Ear: External ear normal.  Left Ear: External ear normal.  Nose: Mucosal edema and rhinorrhea present. No sinus tenderness. Right sinus exhibits no maxillary sinus tenderness and no frontal sinus tenderness. Left sinus exhibits no maxillary sinus tenderness and no frontal sinus tenderness.  Mouth/Throat: Uvula is midline, oropharynx is clear and moist and mucous membranes are normal. No oropharyngeal exudate.  Eyes: Pupils are equal, round, and  reactive to light. Conjunctivae and EOM are normal. Right eye exhibits no discharge. No scleral icterus.  Neck: Neck supple.  Cardiovascular: Normal rate, regular rhythm and normal heart sounds.  No murmur heard. Pulmonary/Chest: Effort normal and breath sounds normal. No respiratory distress. He has no wheezes. He has no rales.  Lymphadenopathy:    He has no cervical adenopathy.  Neurological: He is alert and oriented to person, place, and time.  Skin: Skin is warm and dry. No rash noted. He is not diaphoretic. No erythema.  Psychiatric: He has a normal mood and affect. His behavior is normal.  Nursing note and vitals reviewed.    UC Treatments / Results  Labs (all labs ordered are listed, but only abnormal results are displayed) Labs Reviewed  RAPID INFLUENZA A&B ANTIGENS ([facility] ONLY)    EKG None  Radiology No results found.  Procedures Procedures (including critical care time)  Medications Ordered in UC Medications - No data to display  Initial Impression / Assessment and Plan / UC Course  I have reviewed the triage vital signs and the nursing notes.  Pertinent labs & imaging results that were available during my care of the patient were reviewed by me and considered in my medical decision making (see chart for details).  27 y/o male with complaints of nasal congestion, fatigue and chills. Flu test is negative. Normal VS. Advised patient that he likely has another viral infection which should resolve with symptomatic treatment in 7-10 days. Discussed when to follow up.      Final Clinical Impressions(s) / UC Diagnoses   Final diagnoses:  Acute URI  Nasal congestion     Discharge Instructions     URI/COLD SYMPTOMS: Your flu test was negative today. Your exam today is consistent with a viral illness. Antibiotics are not indicated at this time. Use medications as directed, including cough syrup, nasal saline, and decongestants. Consider use of Flonase. Your symptoms  should improve over the next few days and resolve within 7-10 days. Increase rest and fluids. F/u if symptoms worsen or predominate such as sore throat, ear pain, productive cough, shortness of breath, or if you develop high fevers or worsening fatigue over the next several days.     ED Prescriptions    None     Controlled Substance Prescriptions [place] Controlled Substance Registry consulted? Not Applicable   Gareth Morgan 10/17/18 1125

## 2018-10-17 NOTE — Discharge Instructions (Signed)
URI/COLD SYMPTOMS: Your flu test was negative today. Your exam today is consistent with a viral illness. Antibiotics are not indicated at this time. Use medications as directed, including cough syrup, nasal saline, and decongestants. Consider use of Flonase. Your symptoms should improve over the next few days and resolve within 7-10 days. Increase rest and fluids. F/u if symptoms worsen or predominate such as sore throat, ear pain, productive cough, shortness of breath, or if you develop high fevers or worsening fatigue over the next several days.

## 2018-10-17 NOTE — ED Triage Notes (Signed)
Pt states he has had a runny nose since yesterday and nasal congestion. Taking Sudafed with some improvement. Feels chilled. Denies pain. No fever

## 2019-04-30 IMAGING — US US BIOPSY LYMPH NODE
1 series · 9 of 9 positions shown · non-contrast
Comparison: none

INDICATION: 26-year-old male with a history of right neck mass referred for
biopsy

[Series 1: us biopsy lymph node · 0.06mm/px · 9 of 9 slices shown]
[im 1/9]
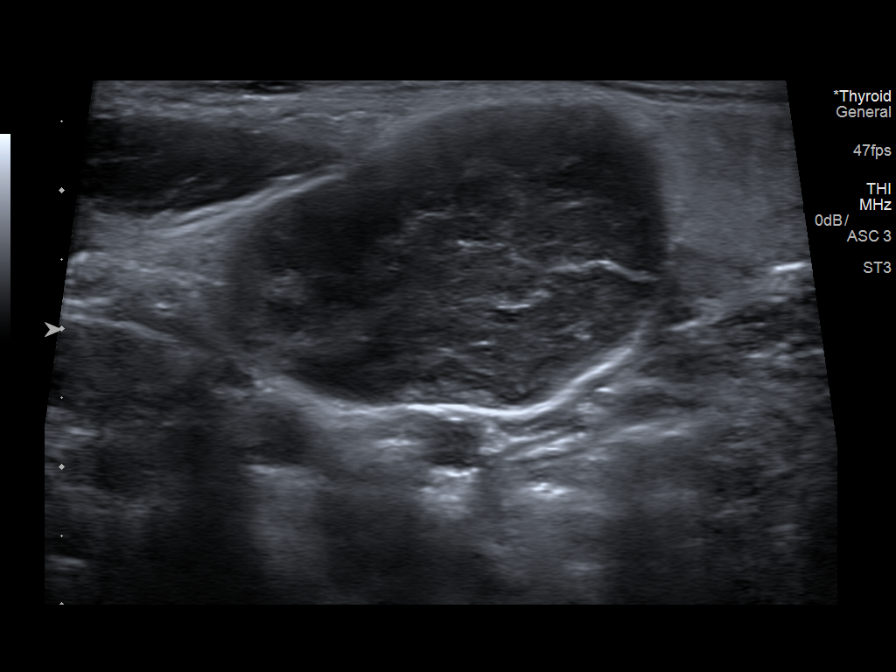
[im 2/9]
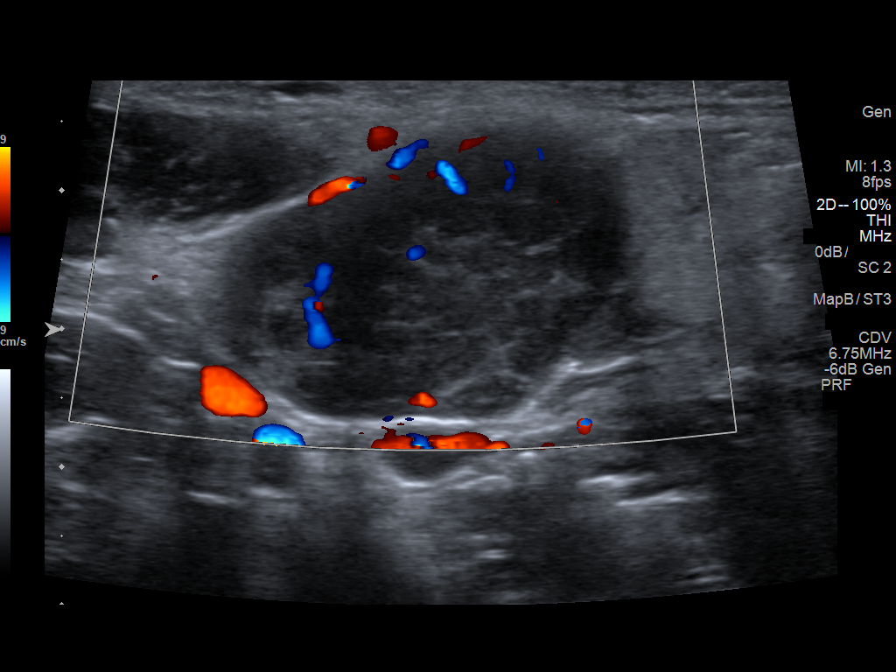
[im 3/9]
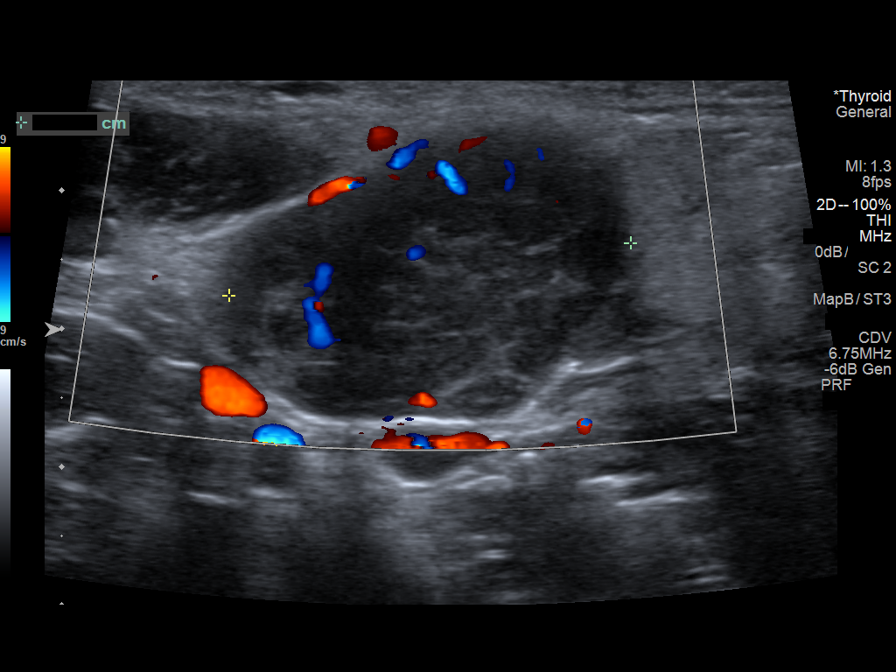
[im 4/9]
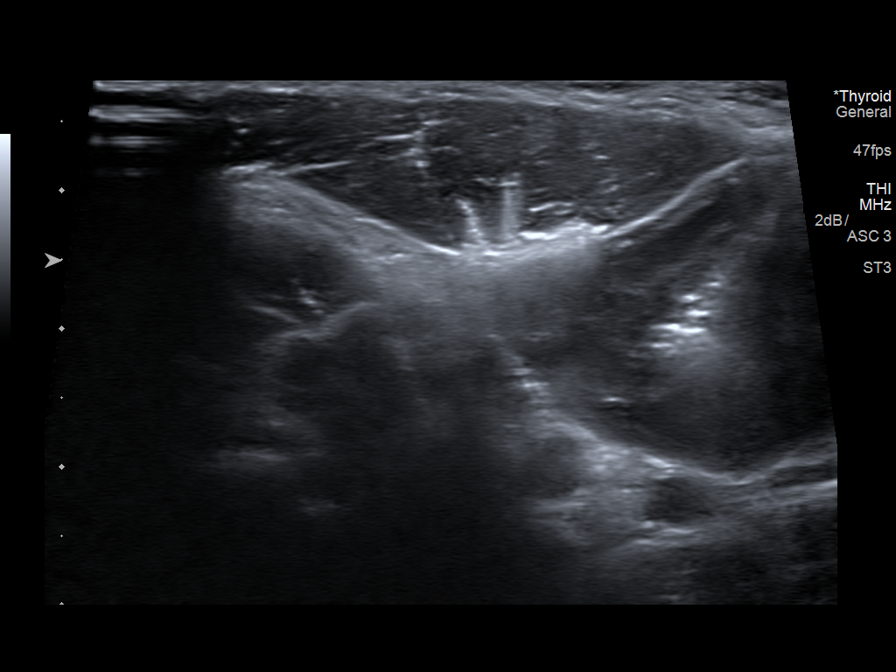
[im 5/9]
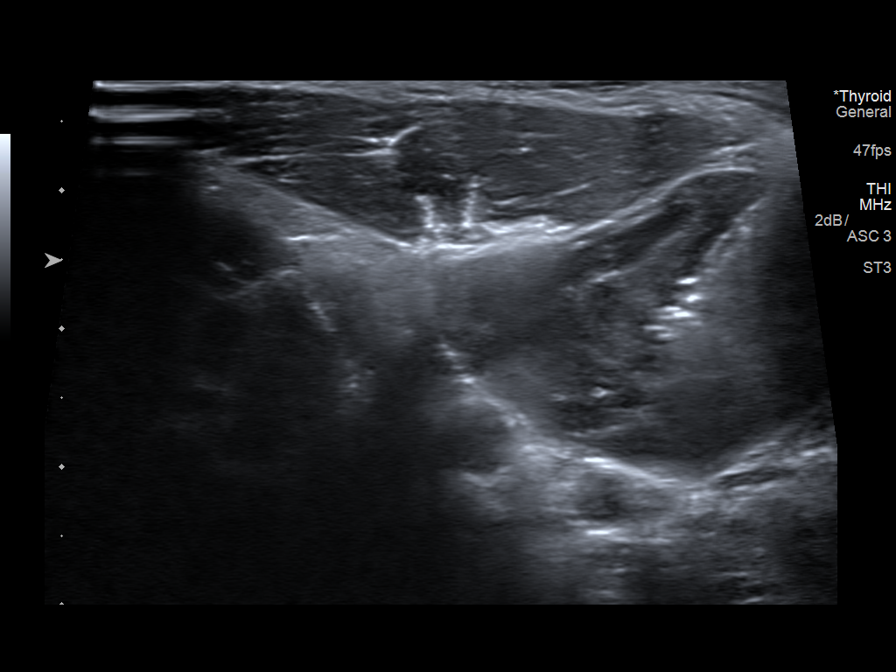
[im 6/9]
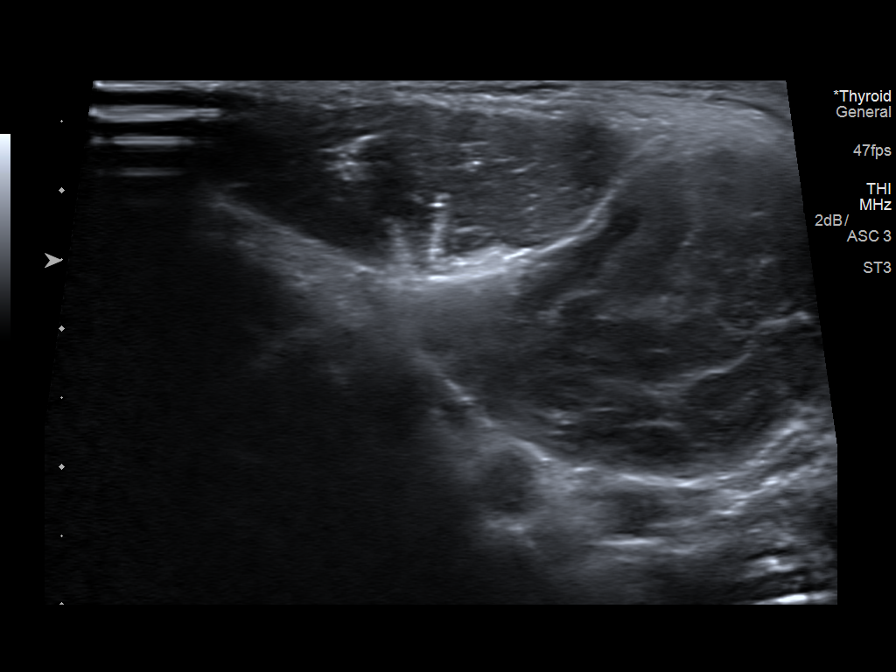
[im 7/9]
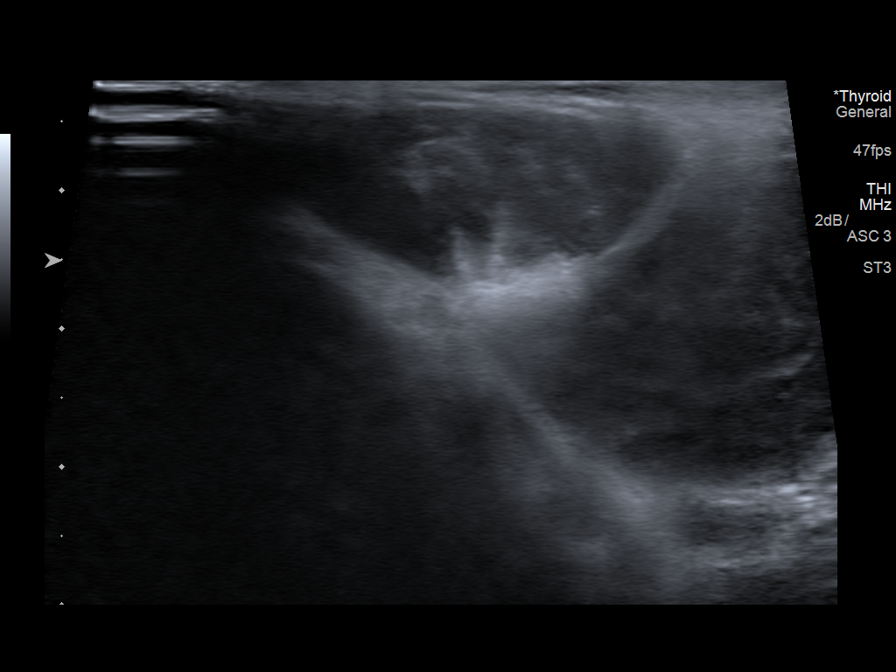
[im 8/9]
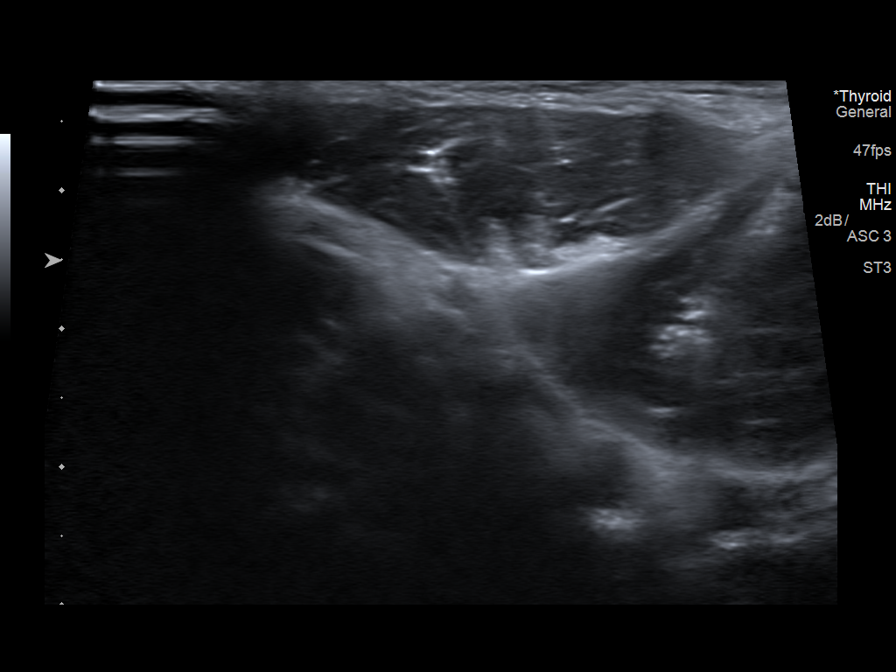
[im 9/9]
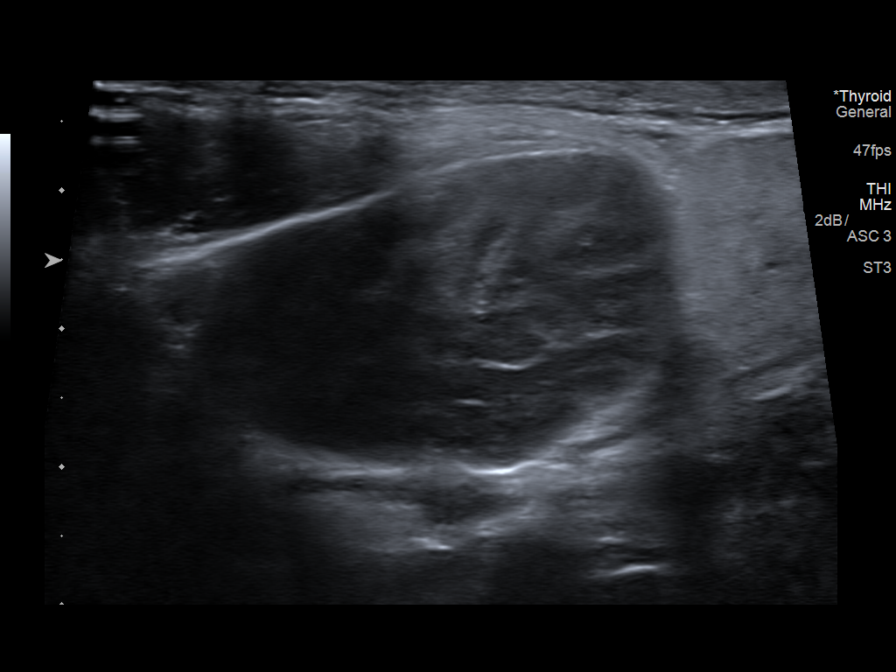

[9 of 9 positions shown; findings below may reference images not displayed]

EXAM:
ULTRASOUND-GUIDED BIOPSY RIGHT NECK MASS

MEDICATIONS:
None.

ANESTHESIA/SEDATION:
Moderate (conscious) sedation was employed during this procedure. A
total of Versed 3.0 mg and Fentanyl 100 mcg was administered
intravenously.

Moderate Sedation Time: 11 minutes. The patient's level of
consciousness and vital signs were monitored continuously by
radiology nursing throughout the procedure under my direct
supervision.

FLUOROSCOPY TIME:  None

COMPLICATIONS:
None

PROCEDURE:
Informed written consent was obtained from the patient after a
thorough discussion of the procedural risks, benefits and
alternatives. All questions were addressed. Maximal Sterile Barrier
Technique was utilized including caps, mask, sterile gowns, sterile
gloves, sterile drape, hand hygiene and skin antiseptic. A timeout
was performed prior to the initiation of the procedure.

Patient positioned supine position on the ultrasound stretcher.
Images acquired at the right neck. Images stored and sent to PACs.

The patient was then prepped and draped in the usual sterile
fashion. The skin and subcutaneous tissues were generously
infiltrated 1% lidocaine for local anesthesia. Small stab incision
was made with 11 blade scalpel. Multiple 18 gauge core biopsy were
acquired of the right neck mass. Tissue specimen placed in the
saline.

Final image was stored.

Patient tolerated the procedure well and remained hemodynamically
stable throughout.

No complications were encountered and no significant blood loss.
IMPRESSION: Status post ultrasound-guided biopsy of right neck mass. Tissue
specimen sent to pathology for complete histopathologic analysis.

## 2019-09-28 ENCOUNTER — Other Ambulatory Visit: Payer: Self-pay

## 2019-09-28 DIAGNOSIS — Z20822 Contact with and (suspected) exposure to covid-19: Secondary | ICD-10-CM

## 2019-09-30 LAB — NOVEL CORONAVIRUS, NAA: SARS-CoV-2, NAA: NOT DETECTED

## 2019-11-16 ENCOUNTER — Other Ambulatory Visit: Payer: Self-pay

## 2019-11-16 DIAGNOSIS — Z20822 Contact with and (suspected) exposure to covid-19: Secondary | ICD-10-CM

## 2019-11-17 LAB — NOVEL CORONAVIRUS, NAA: SARS-CoV-2, NAA: NOT DETECTED

## 2020-02-22 ENCOUNTER — Ambulatory Visit: Payer: 59 | Attending: Internal Medicine

## 2020-02-22 DIAGNOSIS — Z23 Encounter for immunization: Secondary | ICD-10-CM

## 2020-02-22 NOTE — Progress Notes (Signed)
   Covid-19 Vaccination Clinic  Name:  Drayton Tieu    MRN: [id number] DOB: [birth year]  02/22/2020  Mr. Depree was observed post Covid-19 immunization for 15 minutes without incident. He was provided with Vaccine Information Sheet and instruction to access the V-Safe system.   Mr. Rogan was instructed to call 911 with any severe reactions post vaccine: Marland Kitchen Difficulty breathing  . Swelling of face and throat  . A fast heartbeat  . A bad rash all over body  . Dizziness and weakness   Immunizations Administered    Name Date Dose VIS Date Route   Pfizer COVID-19 Vaccine 02/22/2020  4:21 PM 0.3 mL 11/16/2019 Intramuscular   Manufacturer: [employer], [employer]   Lot: [id number]   NDC: [address]-9

## 2020-03-14 ENCOUNTER — Ambulatory Visit: Payer: 59 | Attending: Internal Medicine

## 2020-03-14 DIAGNOSIS — Z23 Encounter for immunization: Secondary | ICD-10-CM

## 2020-03-14 NOTE — Progress Notes (Signed)
   Covid-19 Vaccination Clinic  Name:  Jose Schwartz    MRN: [id number] DOB: [birth year]  03/14/2020  [name] was observed post Covid-19 immunization for 15 minutes without incident. He was provided with Vaccine Information Sheet and instruction to access the V-Safe system.   [name] was instructed to call 911 with any severe reactions post vaccine: Marland Kitchen Difficulty breathing  . Swelling of face and throat  . A fast heartbeat  . A bad rash all over body  . Dizziness and weakness   Immunizations Administered    Name Date Dose VIS Date Route   Pfizer COVID-19 Vaccine 03/14/2020  4:00 PM 0.3 mL 11/16/2019 Intramuscular   Manufacturer: [employer], [employer]   Lot: [id number]   NDC: [address]-0
